# Patient Record
Sex: Male | Born: 1935 | Race: White | Hispanic: No | Smoking: Former smoker
Health system: Southern US, Community
[De-identification: ages and names within clinical notes are randomized; demographics above are authoritative.]

## PROBLEM LIST (undated history)

## (undated) DIAGNOSIS — Z951 Presence of aortocoronary bypass graft: Secondary | ICD-10-CM

## (undated) DIAGNOSIS — G4733 Obstructive sleep apnea (adult) (pediatric): Secondary | ICD-10-CM

## (undated) DIAGNOSIS — R0609 Other forms of dyspnea: Secondary | ICD-10-CM

## (undated) DIAGNOSIS — I2510 Atherosclerotic heart disease of native coronary artery without angina pectoris: Secondary | ICD-10-CM

## (undated) DIAGNOSIS — J432 Centrilobular emphysema: Secondary | ICD-10-CM

## (undated) DIAGNOSIS — L409 Psoriasis, unspecified: Secondary | ICD-10-CM

## (undated) DIAGNOSIS — E119 Type 2 diabetes mellitus without complications: Secondary | ICD-10-CM

## (undated) DIAGNOSIS — E785 Hyperlipidemia, unspecified: Secondary | ICD-10-CM

## (undated) DIAGNOSIS — J849 Interstitial pulmonary disease, unspecified: Secondary | ICD-10-CM

## (undated) DIAGNOSIS — I272 Pulmonary hypertension, unspecified: Secondary | ICD-10-CM

## (undated) DIAGNOSIS — J9611 Chronic respiratory failure with hypoxia: Secondary | ICD-10-CM

## (undated) DIAGNOSIS — I509 Heart failure, unspecified: Secondary | ICD-10-CM

## (undated) DIAGNOSIS — I10 Essential (primary) hypertension: Secondary | ICD-10-CM

## (undated) DIAGNOSIS — R0600 Dyspnea, unspecified: Secondary | ICD-10-CM

## (undated) HISTORY — DX: Type 2 diabetes mellitus without complications: E11.9

## (undated) HISTORY — DX: Atherosclerotic heart disease of native coronary artery without angina pectoris: I25.10

## (undated) HISTORY — PX: CARDIAC SURGERY: SHX584

## (undated) HISTORY — DX: Psoriasis, unspecified: L40.9

## (undated) HISTORY — PX: GALLBLADDER SURGERY: SHX652

## (undated) HISTORY — DX: Obstructive sleep apnea (adult) (pediatric): G47.33

## (undated) HISTORY — DX: Centrilobular emphysema: J43.2

## (undated) HISTORY — DX: Other forms of dyspnea: R06.09

## (undated) HISTORY — PX: PROSTATE SURGERY: SHX751

## (undated) HISTORY — DX: Essential (primary) hypertension: I10

## (undated) HISTORY — DX: Chronic respiratory failure with hypoxia: J96.11

## (undated) HISTORY — DX: Dyspnea, unspecified: R06.00

## (undated) HISTORY — DX: Presence of aortocoronary bypass graft: Z95.1

## (undated) HISTORY — DX: Hyperlipidemia, unspecified: E78.5

## (undated) HISTORY — DX: Pulmonary hypertension, unspecified: I27.20

## (undated) HISTORY — DX: Interstitial pulmonary disease, unspecified: J84.9

---

## 2014-05-09 DIAGNOSIS — I2510 Atherosclerotic heart disease of native coronary artery without angina pectoris: Secondary | ICD-10-CM | POA: Diagnosis not present

## 2014-05-09 DIAGNOSIS — Z79899 Other long term (current) drug therapy: Secondary | ICD-10-CM | POA: Diagnosis not present

## 2014-05-09 DIAGNOSIS — I10 Essential (primary) hypertension: Secondary | ICD-10-CM | POA: Diagnosis not present

## 2014-05-09 DIAGNOSIS — E119 Type 2 diabetes mellitus without complications: Secondary | ICD-10-CM | POA: Diagnosis not present

## 2014-05-09 DIAGNOSIS — E785 Hyperlipidemia, unspecified: Secondary | ICD-10-CM | POA: Diagnosis not present

## 2014-08-11 DIAGNOSIS — Z79899 Other long term (current) drug therapy: Secondary | ICD-10-CM | POA: Diagnosis not present

## 2014-08-11 DIAGNOSIS — I10 Essential (primary) hypertension: Secondary | ICD-10-CM | POA: Diagnosis not present

## 2014-08-11 DIAGNOSIS — I2510 Atherosclerotic heart disease of native coronary artery without angina pectoris: Secondary | ICD-10-CM | POA: Diagnosis not present

## 2014-08-11 DIAGNOSIS — E119 Type 2 diabetes mellitus without complications: Secondary | ICD-10-CM | POA: Diagnosis not present

## 2014-08-11 DIAGNOSIS — E785 Hyperlipidemia, unspecified: Secondary | ICD-10-CM | POA: Diagnosis not present

## 2014-11-17 DIAGNOSIS — E119 Type 2 diabetes mellitus without complications: Secondary | ICD-10-CM | POA: Diagnosis not present

## 2014-11-17 DIAGNOSIS — Z139 Encounter for screening, unspecified: Secondary | ICD-10-CM | POA: Diagnosis not present

## 2014-11-17 DIAGNOSIS — Z1389 Encounter for screening for other disorder: Secondary | ICD-10-CM | POA: Diagnosis not present

## 2014-11-17 DIAGNOSIS — I10 Essential (primary) hypertension: Secondary | ICD-10-CM | POA: Diagnosis not present

## 2014-11-17 DIAGNOSIS — E785 Hyperlipidemia, unspecified: Secondary | ICD-10-CM | POA: Diagnosis not present

## 2014-11-17 DIAGNOSIS — Z9181 History of falling: Secondary | ICD-10-CM | POA: Diagnosis not present

## 2014-11-17 DIAGNOSIS — I2510 Atherosclerotic heart disease of native coronary artery without angina pectoris: Secondary | ICD-10-CM | POA: Diagnosis not present

## 2014-11-17 DIAGNOSIS — Z79899 Other long term (current) drug therapy: Secondary | ICD-10-CM | POA: Diagnosis not present

## 2014-12-27 DIAGNOSIS — E119 Type 2 diabetes mellitus without complications: Secondary | ICD-10-CM | POA: Diagnosis not present

## 2014-12-27 DIAGNOSIS — H43813 Vitreous degeneration, bilateral: Secondary | ICD-10-CM | POA: Diagnosis not present

## 2015-02-20 DIAGNOSIS — I10 Essential (primary) hypertension: Secondary | ICD-10-CM | POA: Diagnosis not present

## 2015-02-20 DIAGNOSIS — Z79899 Other long term (current) drug therapy: Secondary | ICD-10-CM | POA: Diagnosis not present

## 2015-02-20 DIAGNOSIS — E119 Type 2 diabetes mellitus without complications: Secondary | ICD-10-CM | POA: Diagnosis not present

## 2015-02-20 DIAGNOSIS — I2510 Atherosclerotic heart disease of native coronary artery without angina pectoris: Secondary | ICD-10-CM | POA: Diagnosis not present

## 2015-02-20 DIAGNOSIS — E785 Hyperlipidemia, unspecified: Secondary | ICD-10-CM | POA: Diagnosis not present

## 2015-02-20 DIAGNOSIS — Z23 Encounter for immunization: Secondary | ICD-10-CM | POA: Diagnosis not present

## 2015-06-01 DIAGNOSIS — I2510 Atherosclerotic heart disease of native coronary artery without angina pectoris: Secondary | ICD-10-CM | POA: Diagnosis not present

## 2015-06-01 DIAGNOSIS — I10 Essential (primary) hypertension: Secondary | ICD-10-CM | POA: Diagnosis not present

## 2015-06-01 DIAGNOSIS — E119 Type 2 diabetes mellitus without complications: Secondary | ICD-10-CM | POA: Diagnosis not present

## 2015-06-01 DIAGNOSIS — E785 Hyperlipidemia, unspecified: Secondary | ICD-10-CM | POA: Diagnosis not present

## 2015-06-01 DIAGNOSIS — Z79899 Other long term (current) drug therapy: Secondary | ICD-10-CM | POA: Diagnosis not present

## 2015-06-14 DIAGNOSIS — I2510 Atherosclerotic heart disease of native coronary artery without angina pectoris: Secondary | ICD-10-CM | POA: Insufficient documentation

## 2015-06-14 DIAGNOSIS — I10 Essential (primary) hypertension: Secondary | ICD-10-CM | POA: Diagnosis not present

## 2015-06-14 DIAGNOSIS — Z951 Presence of aortocoronary bypass graft: Secondary | ICD-10-CM | POA: Diagnosis not present

## 2015-06-14 DIAGNOSIS — E785 Hyperlipidemia, unspecified: Secondary | ICD-10-CM | POA: Insufficient documentation

## 2015-06-14 DIAGNOSIS — G4733 Obstructive sleep apnea (adult) (pediatric): Secondary | ICD-10-CM | POA: Insufficient documentation

## 2015-06-27 DIAGNOSIS — E785 Hyperlipidemia, unspecified: Secondary | ICD-10-CM | POA: Diagnosis not present

## 2015-06-27 DIAGNOSIS — Z951 Presence of aortocoronary bypass graft: Secondary | ICD-10-CM | POA: Diagnosis not present

## 2015-06-27 DIAGNOSIS — I10 Essential (primary) hypertension: Secondary | ICD-10-CM | POA: Diagnosis not present

## 2015-06-27 DIAGNOSIS — I2510 Atherosclerotic heart disease of native coronary artery without angina pectoris: Secondary | ICD-10-CM | POA: Diagnosis not present

## 2015-06-27 DIAGNOSIS — G4733 Obstructive sleep apnea (adult) (pediatric): Secondary | ICD-10-CM | POA: Diagnosis not present

## 2015-08-02 DIAGNOSIS — I10 Essential (primary) hypertension: Secondary | ICD-10-CM | POA: Diagnosis not present

## 2015-08-02 DIAGNOSIS — E785 Hyperlipidemia, unspecified: Secondary | ICD-10-CM | POA: Diagnosis not present

## 2015-08-02 DIAGNOSIS — I2510 Atherosclerotic heart disease of native coronary artery without angina pectoris: Secondary | ICD-10-CM | POA: Diagnosis not present

## 2015-08-02 DIAGNOSIS — Z951 Presence of aortocoronary bypass graft: Secondary | ICD-10-CM | POA: Diagnosis not present

## 2015-08-02 DIAGNOSIS — G4733 Obstructive sleep apnea (adult) (pediatric): Secondary | ICD-10-CM | POA: Diagnosis not present

## 2015-08-29 DIAGNOSIS — H353131 Nonexudative age-related macular degeneration, bilateral, early dry stage: Secondary | ICD-10-CM | POA: Diagnosis not present

## 2015-08-30 DIAGNOSIS — Z79899 Other long term (current) drug therapy: Secondary | ICD-10-CM | POA: Diagnosis not present

## 2015-08-30 DIAGNOSIS — E785 Hyperlipidemia, unspecified: Secondary | ICD-10-CM | POA: Diagnosis not present

## 2015-08-30 DIAGNOSIS — I2510 Atherosclerotic heart disease of native coronary artery without angina pectoris: Secondary | ICD-10-CM | POA: Diagnosis not present

## 2015-08-30 DIAGNOSIS — E119 Type 2 diabetes mellitus without complications: Secondary | ICD-10-CM | POA: Diagnosis not present

## 2015-08-30 DIAGNOSIS — I10 Essential (primary) hypertension: Secondary | ICD-10-CM | POA: Diagnosis not present

## 2015-11-05 DIAGNOSIS — G4733 Obstructive sleep apnea (adult) (pediatric): Secondary | ICD-10-CM | POA: Diagnosis not present

## 2015-11-05 DIAGNOSIS — I272 Pulmonary hypertension, unspecified: Secondary | ICD-10-CM | POA: Insufficient documentation

## 2015-11-05 DIAGNOSIS — I2510 Atherosclerotic heart disease of native coronary artery without angina pectoris: Secondary | ICD-10-CM | POA: Diagnosis not present

## 2015-11-05 DIAGNOSIS — I10 Essential (primary) hypertension: Secondary | ICD-10-CM | POA: Diagnosis not present

## 2015-11-05 DIAGNOSIS — E785 Hyperlipidemia, unspecified: Secondary | ICD-10-CM | POA: Diagnosis not present

## 2015-11-05 DIAGNOSIS — Z951 Presence of aortocoronary bypass graft: Secondary | ICD-10-CM | POA: Diagnosis not present

## 2015-12-05 DIAGNOSIS — I10 Essential (primary) hypertension: Secondary | ICD-10-CM | POA: Diagnosis not present

## 2015-12-05 DIAGNOSIS — Z79899 Other long term (current) drug therapy: Secondary | ICD-10-CM | POA: Diagnosis not present

## 2015-12-05 DIAGNOSIS — E119 Type 2 diabetes mellitus without complications: Secondary | ICD-10-CM | POA: Diagnosis not present

## 2015-12-05 DIAGNOSIS — Z9181 History of falling: Secondary | ICD-10-CM | POA: Diagnosis not present

## 2015-12-05 DIAGNOSIS — Z1389 Encounter for screening for other disorder: Secondary | ICD-10-CM | POA: Diagnosis not present

## 2015-12-05 DIAGNOSIS — E785 Hyperlipidemia, unspecified: Secondary | ICD-10-CM | POA: Diagnosis not present

## 2015-12-05 DIAGNOSIS — I2510 Atherosclerotic heart disease of native coronary artery without angina pectoris: Secondary | ICD-10-CM | POA: Diagnosis not present

## 2016-02-11 DIAGNOSIS — E119 Type 2 diabetes mellitus without complications: Secondary | ICD-10-CM | POA: Diagnosis not present

## 2016-02-11 DIAGNOSIS — H524 Presbyopia: Secondary | ICD-10-CM | POA: Diagnosis not present

## 2016-03-06 DIAGNOSIS — H35373 Puckering of macula, bilateral: Secondary | ICD-10-CM | POA: Diagnosis not present

## 2016-03-11 DIAGNOSIS — Z79899 Other long term (current) drug therapy: Secondary | ICD-10-CM | POA: Diagnosis not present

## 2016-03-11 DIAGNOSIS — E785 Hyperlipidemia, unspecified: Secondary | ICD-10-CM | POA: Diagnosis not present

## 2016-03-11 DIAGNOSIS — E119 Type 2 diabetes mellitus without complications: Secondary | ICD-10-CM | POA: Diagnosis not present

## 2016-03-11 DIAGNOSIS — I10 Essential (primary) hypertension: Secondary | ICD-10-CM | POA: Diagnosis not present

## 2016-03-11 DIAGNOSIS — I2510 Atherosclerotic heart disease of native coronary artery without angina pectoris: Secondary | ICD-10-CM | POA: Diagnosis not present

## 2016-08-19 DIAGNOSIS — E119 Type 2 diabetes mellitus without complications: Secondary | ICD-10-CM | POA: Diagnosis not present

## 2016-08-19 DIAGNOSIS — Z79899 Other long term (current) drug therapy: Secondary | ICD-10-CM | POA: Diagnosis not present

## 2016-08-19 DIAGNOSIS — E785 Hyperlipidemia, unspecified: Secondary | ICD-10-CM | POA: Diagnosis not present

## 2016-08-19 DIAGNOSIS — I10 Essential (primary) hypertension: Secondary | ICD-10-CM | POA: Diagnosis not present

## 2016-08-19 DIAGNOSIS — I2510 Atherosclerotic heart disease of native coronary artery without angina pectoris: Secondary | ICD-10-CM | POA: Diagnosis not present

## 2016-09-04 DIAGNOSIS — E113293 Type 2 diabetes mellitus with mild nonproliferative diabetic retinopathy without macular edema, bilateral: Secondary | ICD-10-CM | POA: Diagnosis not present

## 2016-09-04 DIAGNOSIS — H35373 Puckering of macula, bilateral: Secondary | ICD-10-CM | POA: Diagnosis not present

## 2016-11-20 DIAGNOSIS — E119 Type 2 diabetes mellitus without complications: Secondary | ICD-10-CM | POA: Diagnosis not present

## 2016-11-20 DIAGNOSIS — Z23 Encounter for immunization: Secondary | ICD-10-CM | POA: Diagnosis not present

## 2016-11-20 DIAGNOSIS — E785 Hyperlipidemia, unspecified: Secondary | ICD-10-CM | POA: Diagnosis not present

## 2016-11-20 DIAGNOSIS — I10 Essential (primary) hypertension: Secondary | ICD-10-CM | POA: Diagnosis not present

## 2016-11-20 DIAGNOSIS — I2510 Atherosclerotic heart disease of native coronary artery without angina pectoris: Secondary | ICD-10-CM | POA: Diagnosis not present

## 2016-11-20 DIAGNOSIS — Z79899 Other long term (current) drug therapy: Secondary | ICD-10-CM | POA: Diagnosis not present

## 2016-11-20 DIAGNOSIS — Z139 Encounter for screening, unspecified: Secondary | ICD-10-CM | POA: Diagnosis not present

## 2017-02-26 DIAGNOSIS — I10 Essential (primary) hypertension: Secondary | ICD-10-CM | POA: Diagnosis not present

## 2017-02-26 DIAGNOSIS — Z79899 Other long term (current) drug therapy: Secondary | ICD-10-CM | POA: Diagnosis not present

## 2017-02-26 DIAGNOSIS — Z1389 Encounter for screening for other disorder: Secondary | ICD-10-CM | POA: Diagnosis not present

## 2017-02-26 DIAGNOSIS — E785 Hyperlipidemia, unspecified: Secondary | ICD-10-CM | POA: Diagnosis not present

## 2017-02-26 DIAGNOSIS — E119 Type 2 diabetes mellitus without complications: Secondary | ICD-10-CM | POA: Diagnosis not present

## 2017-02-26 DIAGNOSIS — Z23 Encounter for immunization: Secondary | ICD-10-CM | POA: Diagnosis not present

## 2017-02-26 DIAGNOSIS — I2510 Atherosclerotic heart disease of native coronary artery without angina pectoris: Secondary | ICD-10-CM | POA: Diagnosis not present

## 2017-02-26 DIAGNOSIS — Z139 Encounter for screening, unspecified: Secondary | ICD-10-CM | POA: Diagnosis not present

## 2017-02-26 DIAGNOSIS — Z9181 History of falling: Secondary | ICD-10-CM | POA: Diagnosis not present

## 2017-06-01 DIAGNOSIS — I2510 Atherosclerotic heart disease of native coronary artery without angina pectoris: Secondary | ICD-10-CM | POA: Diagnosis not present

## 2017-06-01 DIAGNOSIS — Z79899 Other long term (current) drug therapy: Secondary | ICD-10-CM | POA: Diagnosis not present

## 2017-06-01 DIAGNOSIS — I10 Essential (primary) hypertension: Secondary | ICD-10-CM | POA: Diagnosis not present

## 2017-06-01 DIAGNOSIS — E785 Hyperlipidemia, unspecified: Secondary | ICD-10-CM | POA: Diagnosis not present

## 2017-06-01 DIAGNOSIS — E119 Type 2 diabetes mellitus without complications: Secondary | ICD-10-CM | POA: Diagnosis not present

## 2017-06-01 DIAGNOSIS — Z139 Encounter for screening, unspecified: Secondary | ICD-10-CM | POA: Diagnosis not present

## 2017-07-21 DIAGNOSIS — H43813 Vitreous degeneration, bilateral: Secondary | ICD-10-CM | POA: Diagnosis not present

## 2017-07-21 DIAGNOSIS — E119 Type 2 diabetes mellitus without complications: Secondary | ICD-10-CM | POA: Diagnosis not present

## 2017-09-02 DIAGNOSIS — I2510 Atherosclerotic heart disease of native coronary artery without angina pectoris: Secondary | ICD-10-CM | POA: Diagnosis not present

## 2017-09-02 DIAGNOSIS — E785 Hyperlipidemia, unspecified: Secondary | ICD-10-CM | POA: Diagnosis not present

## 2017-09-02 DIAGNOSIS — E119 Type 2 diabetes mellitus without complications: Secondary | ICD-10-CM | POA: Diagnosis not present

## 2017-09-02 DIAGNOSIS — I10 Essential (primary) hypertension: Secondary | ICD-10-CM | POA: Diagnosis not present

## 2017-09-02 DIAGNOSIS — Z79899 Other long term (current) drug therapy: Secondary | ICD-10-CM | POA: Diagnosis not present

## 2017-11-02 ENCOUNTER — Encounter: Payer: Self-pay | Admitting: Cardiology

## 2017-11-02 ENCOUNTER — Ambulatory Visit (INDEPENDENT_AMBULATORY_CARE_PROVIDER_SITE_OTHER): Payer: Medicare Other | Admitting: Cardiology

## 2017-11-02 VITALS — BP 126/78 | HR 70 | Ht 70.0 in | Wt 176.0 lb

## 2017-11-02 DIAGNOSIS — I10 Essential (primary) hypertension: Secondary | ICD-10-CM | POA: Diagnosis not present

## 2017-11-02 DIAGNOSIS — I2510 Atherosclerotic heart disease of native coronary artery without angina pectoris: Secondary | ICD-10-CM | POA: Diagnosis not present

## 2017-11-02 DIAGNOSIS — I272 Pulmonary hypertension, unspecified: Secondary | ICD-10-CM

## 2017-11-02 DIAGNOSIS — R002 Palpitations: Secondary | ICD-10-CM

## 2017-11-02 DIAGNOSIS — E785 Hyperlipidemia, unspecified: Secondary | ICD-10-CM

## 2017-11-02 DIAGNOSIS — Z951 Presence of aortocoronary bypass graft: Secondary | ICD-10-CM

## 2017-11-02 DIAGNOSIS — G4733 Obstructive sleep apnea (adult) (pediatric): Secondary | ICD-10-CM | POA: Diagnosis not present

## 2017-11-02 NOTE — Patient Instructions (Signed)
Medication Instructions:  Your physician recommends that you continue on your current medications as directed. Please refer to the Current Medication list given to you today.  Labwork: None  Testing/Procedures: Your physician has requested that you have an echocardiogram. Echocardiography is a painless test that uses sound waves to create images of your heart. It provides your doctor with information about the size and shape of your heart and how well your heart's chambers and valves are working. This procedure takes approximately one hour. There are no restrictions for this procedure.  Your physician has recommended that you wear a holter monitor. Holter monitors are medical devices that record the heart's electrical activity. Doctors most often use these monitors to diagnose arrhythmias. Arrhythmias are problems with the speed or rhythm of the heartbeat. The monitor is a small, portable device. You can wear one while you do your normal daily activities. This is usually used to diagnose what is causing palpitations/syncope (passing out).  Follow-Up: Your physician recommends that you schedule a follow-up appointment in: 3 months  Any Other Special Instructions Will Be Listed Below (If Applicable).     If you need a refill on your cardiac medications before your next appointment, please call your pharmacy.   [place], RN, BSN

## 2017-11-02 NOTE — Progress Notes (Signed)
Cardiology Office Note:    Date:  11/02/2017   ID:  Louis Houston, DOB May 31, 1931, MRN [id number]  PCP:  Louis Dress, MD  Cardiologist:  Jenne Campus, MD    Referring MD: No ref. provider found   No chief complaint on file. Doing well cardiac wise  History of Present Illness:    Louis Houston is a 84 y.o. male with coronary artery disease diabetes sleep apnea dyslipidemia comes to my office for follow-up of all.  He is doing well however his wife of 14 years died recently and obviously she is grieving about that.  However he started walking on the regular basis with his dog scooter.  Denies having any chest pain tightness squeezing pressure burning chest no dizziness no passing out.  He does not use CPAP mask.  History reviewed. No pertinent past medical history.  Past Surgical History:  Procedure Laterality Date  . CARDIAC SURGERY    . GALLBLADDER SURGERY    . PROSTATE SURGERY      Current Medications: Current Meds  Medication Sig  . amLODipine (NORVASC) 10 MG tablet Take 10 mg by mouth daily.  Marland Kitchen aspirin 81 MG EC tablet Take 81 mg by mouth daily.  . furosemide (LASIX) 40 MG tablet Take 1 tablet by mouth daily.  Marland Kitchen glipiZIDE (GLUCOTROL) 5 MG tablet Take 1 tablet by mouth 2 (two) times daily.  . hydrALAZINE (APRESOLINE) 25 MG tablet Take 25 mg by mouth 3 (three) times daily.  Marland Kitchen lisinopril (PRINIVIL,ZESTRIL) 40 MG tablet Take 1 tablet by mouth daily.  . metFORMIN (GLUCOPHAGE) 500 MG tablet Take 1 tablet by mouth daily.  . metoprolol tartrate (LOPRESSOR) 50 MG tablet Take 1 tablet by mouth 2 (two) times daily.  . Multiple Vitamin (MULTI-VITAMINS) TABS Take 1 tablet by mouth daily.  . potassium chloride (K-DUR) 10 MEQ tablet Take 1 tablet by mouth daily.  . simvastatin (ZOCOR) 40 MG tablet Take 1 tablet by mouth daily.     Allergies:   Patient has no known allergies.   Social History   Socioeconomic History  . Marital status: Widowed    Spouse name: Not on file    . Number of children: Not on file  . Years of education: Not on file  . Highest education level: Not on file  Occupational History  . Not on file  Social Needs  . Financial resource strain: Not on file  . Food insecurity:    Worry: Not on file    Inability: Not on file  . Transportation needs:    Medical: Not on file    Non-medical: Not on file  Tobacco Use  . Smoking status: Former Research scientist (life sciences)  . Smokeless tobacco: Never Used  Substance and Sexual Activity  . Alcohol use: Not on file  . Drug use: Not on file  . Sexual activity: Not on file  Lifestyle  . Physical activity:    Days per week: Not on file    Minutes per session: Not on file  . Stress: Not on file  Relationships  . Social connections:    Talks on phone: Not on file    Gets together: Not on file    Attends religious service: Not on file    Active member of club or organization: Not on file    Attends meetings of clubs or organizations: Not on file    Relationship status: Not on file  Other Topics Concern  . Not on file  Social History Narrative  .  Not on file     Family History: The patient's family history includes Diabetes in his mother and sister. ROS:   Please see the history of present illness.    All 14 point review of systems negative except as described per history of present illness  EKGs/Labs/Other Studies Reviewed:    EKG showed normal sinus rhythm normal P interval APCs nonspecific ST segment changes  Recent Labs: No results found for requested labs within last 8760 hours.  Recent Lipid Panel No results found for: CHOL, TRIG, HDL, CHOLHDL, VLDL, LDLCALC, LDLDIRECT  Physical Exam:    VS:  BP 126/78 (BP Location: Right Arm, Patient Position: Sitting, Cuff Size: Normal)   Pulse 70   Ht _0  (1.778 m)   Wt 176 lb (79.8 kg)   SpO2 98%   BMI 25.25 kg/m     Wt Readings from Last 3 Encounters:  11/02/17 176 lb (79.8 kg)     GEN:  Well nourished, well developed in no acute  distress HEENT: Normal NECK: No JVD; No carotid bruits LYMPHATICS: No lymphadenopathy CARDIAC: RRR, no murmurs, no rubs, no gallops RESPIRATORY:  Clear to auscultation without rales, wheezing or rhonchi  ABDOMEN: Soft, non-tender, non-distended MUSCULOSKELETAL:  No edema; No deformity  SKIN: Warm and dry LOWER EXTREMITIES: no swelling NEUROLOGIC:  Alert and oriented x 3 PSYCHIATRIC:  Normal affect   ASSESSMENT:    1. Coronary artery disease involving native coronary artery of native heart without angina pectoris   2. Essential hypertension   3. Pulmonary hypertension ([place])   4. Obstructive sleep apnea   5. Dyslipidemia   6. Status post coronary artery bypass graft    PLAN:    In order of problems listed above:  1. Coronary artery disease denies having any chest pain tightness squeezing pressure burning chest.  Asking to repeat echocardiogram to assess left ventricular ejection fraction.   2. Essential hypertension: Blood pressure well controlled we will continue present management.  3. Pulmonary hypertension: Echocardiogram will be done to check on that. 4. Dyslipidemia: I will call primary care physician to get his fasting lipid profile. 5. Status post coronary artery bypass graft.  Doing well from that point we will continue present management. 6. Obstructive sleep apnea: Does not use CPAP mask he said he does not want to use it and he will not use it.  We will recheck his pulmonary pressure by doing echocardiogram and then advise on that  7.  Frequent supraventricular ectopy.  Will put 24-hour Holter monitor.  See him back in my office in about 3 months or sooner if he has a problem.   Medication Adjustments/Labs and Tests Ordered: Current medicines are reviewed at length with the patient today.  Concerns regarding medicines are outlined above.  No orders of the defined types were placed in this encounter.  Medication changes: No orders of the defined types were placed in  this encounter.   Signed, Park Liter, MD, [facility] 11/02/2017 9:31 AM    [place]

## 2017-11-30 ENCOUNTER — Ambulatory Visit (INDEPENDENT_AMBULATORY_CARE_PROVIDER_SITE_OTHER): Payer: Medicare Other

## 2017-11-30 ENCOUNTER — Other Ambulatory Visit: Payer: Self-pay

## 2017-11-30 DIAGNOSIS — R002 Palpitations: Secondary | ICD-10-CM

## 2017-11-30 DIAGNOSIS — I2510 Atherosclerotic heart disease of native coronary artery without angina pectoris: Secondary | ICD-10-CM

## 2017-11-30 NOTE — Progress Notes (Signed)
Echocardiogram has been performed.  Louis Houston

## 2017-12-03 DIAGNOSIS — E119 Type 2 diabetes mellitus without complications: Secondary | ICD-10-CM | POA: Diagnosis not present

## 2017-12-03 DIAGNOSIS — Z79899 Other long term (current) drug therapy: Secondary | ICD-10-CM | POA: Diagnosis not present

## 2017-12-03 DIAGNOSIS — Z9181 History of falling: Secondary | ICD-10-CM | POA: Diagnosis not present

## 2017-12-03 DIAGNOSIS — E785 Hyperlipidemia, unspecified: Secondary | ICD-10-CM | POA: Diagnosis not present

## 2017-12-03 DIAGNOSIS — Z1331 Encounter for screening for depression: Secondary | ICD-10-CM | POA: Diagnosis not present

## 2017-12-03 DIAGNOSIS — Z0000 Encounter for general adult medical examination without abnormal findings: Secondary | ICD-10-CM | POA: Diagnosis not present

## 2017-12-03 DIAGNOSIS — Z125 Encounter for screening for malignant neoplasm of prostate: Secondary | ICD-10-CM | POA: Diagnosis not present

## 2018-01-13 ENCOUNTER — Other Ambulatory Visit: Payer: Self-pay

## 2018-01-13 NOTE — Patient Outreach (Signed)
[facility] Care Management  01/13/2018  ANIBAL QUINBY 1932-02-23 [id number]   Medication Adherence call to Mr. Jessi Jessop patient did not answer patient is due on Glipizide 5 mg and Lisinopril 40 mg. Mr. Staheli is showing past due under [place].   [place] Management Direct Dial [phone]  Fax [phone] [email or web address]

## 2018-01-20 DIAGNOSIS — H353132 Nonexudative age-related macular degeneration, bilateral, intermediate dry stage: Secondary | ICD-10-CM | POA: Diagnosis not present

## 2018-02-02 ENCOUNTER — Other Ambulatory Visit: Payer: Self-pay

## 2018-02-02 NOTE — Patient Outreach (Signed)
Louis Houston) Care Management  02/02/2018  Louis Houston June 08, 1931 [id number]   Medication Adherence call to Mr. Louis Houston  patient did not want to engage patient did not want to give birthday. Patient is due on Glipizide 5 mg and Lisinopril 40 mg. Louis Houston is showing past due under [place].   [place] Management Direct Dial [phone]  Fax [phone] [name]

## 2018-03-09 DIAGNOSIS — I10 Essential (primary) hypertension: Secondary | ICD-10-CM | POA: Diagnosis not present

## 2018-03-09 DIAGNOSIS — E119 Type 2 diabetes mellitus without complications: Secondary | ICD-10-CM | POA: Diagnosis not present

## 2018-03-09 DIAGNOSIS — I2510 Atherosclerotic heart disease of native coronary artery without angina pectoris: Secondary | ICD-10-CM | POA: Diagnosis not present

## 2018-03-09 DIAGNOSIS — E785 Hyperlipidemia, unspecified: Secondary | ICD-10-CM | POA: Diagnosis not present

## 2018-03-09 DIAGNOSIS — Z23 Encounter for immunization: Secondary | ICD-10-CM | POA: Diagnosis not present

## 2018-03-09 DIAGNOSIS — Z79899 Other long term (current) drug therapy: Secondary | ICD-10-CM | POA: Diagnosis not present

## 2018-03-11 ENCOUNTER — Ambulatory Visit (INDEPENDENT_AMBULATORY_CARE_PROVIDER_SITE_OTHER): Payer: Medicare Other | Admitting: Cardiology

## 2018-03-11 ENCOUNTER — Encounter: Payer: Self-pay | Admitting: Cardiology

## 2018-03-11 VITALS — BP 140/70 | HR 58 | Ht 70.0 in | Wt 184.6 lb

## 2018-03-11 DIAGNOSIS — Z951 Presence of aortocoronary bypass graft: Secondary | ICD-10-CM

## 2018-03-11 DIAGNOSIS — I272 Pulmonary hypertension, unspecified: Secondary | ICD-10-CM | POA: Diagnosis not present

## 2018-03-11 DIAGNOSIS — I2510 Atherosclerotic heart disease of native coronary artery without angina pectoris: Secondary | ICD-10-CM | POA: Diagnosis not present

## 2018-03-11 DIAGNOSIS — I10 Essential (primary) hypertension: Secondary | ICD-10-CM | POA: Diagnosis not present

## 2018-03-11 DIAGNOSIS — G4733 Obstructive sleep apnea (adult) (pediatric): Secondary | ICD-10-CM

## 2018-03-11 DIAGNOSIS — E785 Hyperlipidemia, unspecified: Secondary | ICD-10-CM | POA: Diagnosis not present

## 2018-03-11 NOTE — Progress Notes (Signed)
Cardiology Office Note:    Date:  03/11/2018   ID:  RHONDA VANGIESON, DOB 04/16/1932, MRN [id number]  PCP:  Nicoletta Dress, MD  Cardiologist:  Jenne Campus, MD    Referring MD: Nicoletta Dress, MD   Chief Complaint  Patient presents with  . Follow-up  Doing well  History of Present Illness:    Louis Houston is a 84 y.o. male with coronary artery disease status post coronary artery bypass graft, pulmonary hypertension with pulmonary pressure neb-year-old 50 to 70 mmHg, essential hypertension, sleep apnea comes today to my office follow-up overall doing well he lost his wife about a year ago but seems to be doing well he does have a dog named scooter that he walks with all the time.  Sometimes when he will walk uphill he had to slow down and stop to catch his breath but overall no change.  This is table pattern.  Denies have any chest pain tightness squeezing pressure burning chest.  2 of his daughter he lives close by one across the street from him she check on him every day and she brings him some food.  No past medical history on file.  Past Surgical History:  Procedure Laterality Date  . CARDIAC SURGERY    . GALLBLADDER SURGERY    . PROSTATE SURGERY      Current Medications: Current Meds  Medication Sig  . amLODipine (NORVASC) 10 MG tablet Take 10 mg by mouth daily.  Marland Kitchen aspirin 81 MG EC tablet Take 81 mg by mouth daily.  . furosemide (LASIX) 40 MG tablet Take 1 tablet by mouth daily.  Marland Kitchen glipiZIDE (GLUCOTROL) 5 MG tablet Take 1 tablet by mouth 2 (two) times daily.  . hydrALAZINE (APRESOLINE) 25 MG tablet Take 25 mg by mouth 2 (two) times daily.   Marland Kitchen lisinopril (PRINIVIL,ZESTRIL) 40 MG tablet Take 1 tablet by mouth daily.  . metFORMIN (GLUCOPHAGE) 500 MG tablet Take 1 tablet by mouth daily.  . metoprolol tartrate (LOPRESSOR) 50 MG tablet Take 1 tablet by mouth 2 (two) times daily.  . Multiple Vitamin (MULTI-VITAMINS) TABS Take 1 tablet by mouth daily.  . potassium  chloride (K-DUR) 10 MEQ tablet Take 1 tablet by mouth daily.  . simvastatin (ZOCOR) 40 MG tablet Take 1 tablet by mouth daily.     Allergies:   Patient has no known allergies.   Social History   Socioeconomic History  . Marital status: Widowed    Spouse name: Not on file  . Number of children: Not on file  . Years of education: Not on file  . Highest education level: Not on file  Occupational History  . Not on file  Social Needs  . Financial resource strain: Not on file  . Food insecurity:    Worry: Not on file    Inability: Not on file  . Transportation needs:    Medical: Not on file    Non-medical: Not on file  Tobacco Use  . Smoking status: Former Research scientist (life sciences)  . Smokeless tobacco: Never Used  Substance and Sexual Activity  . Alcohol use: Not on file  . Drug use: Not on file  . Sexual activity: Not on file  Lifestyle  . Physical activity:    Days per week: Not on file    Minutes per session: Not on file  . Stress: Not on file  Relationships  . Social connections:    Talks on phone: Not on file    Gets together: Not  on file    Attends religious service: Not on file    Active member of club or organization: Not on file    Attends meetings of clubs or organizations: Not on file    Relationship status: Not on file  Other Topics Concern  . Not on file  Social History Narrative  . Not on file     Family History: The patient's family history includes Diabetes in his mother and sister. ROS:   Please see the history of present illness.    All 14 point review of systems negative except as described per history of present illness  EKGs/Labs/Other Studies Reviewed:      Recent Labs: No results found for requested labs within last 8760 hours.  Recent Lipid Panel No results found for: CHOL, TRIG, HDL, CHOLHDL, VLDL, LDLCALC, LDLDIRECT  Physical Exam:    VS:  BP 140/70   Pulse (!) 58   Ht _0  (1.778 m)   Wt 184 lb 9.6 oz (83.7 kg)   SpO2 94%   BMI 26.49 kg/m      Wt Readings from Last 3 Encounters:  03/11/18 184 lb 9.6 oz (83.7 kg)  11/02/17 176 lb (79.8 kg)     GEN:  Well nourished, well developed in no acute distress HEENT: Normal NECK: No JVD; No carotid bruits LYMPHATICS: No lymphadenopathy CARDIAC: RRR, no murmurs, no rubs, no gallops RESPIRATORY:  Clear to auscultation without rales, wheezing or rhonchi  ABDOMEN: Soft, non-tender, non-distended MUSCULOSKELETAL:  No edema; No deformity  SKIN: Warm and dry LOWER EXTREMITIES: no swelling NEUROLOGIC:  Alert and oriented x 3 PSYCHIATRIC:  Normal affect   ASSESSMENT:    1. Coronary artery disease involving native coronary artery of native heart without angina pectoris   2. Essential hypertension   3. Pulmonary hypertension ([place])   4. Obstructive sleep apnea   5. Dyslipidemia   6. Status post coronary artery bypass graft    PLAN:    In order of problems listed above:  1. Coronary artery disease doing well from that point review asymptomatic continue present management. 2. Essential hypertension doing well from that point review.  Blood pressure well controlled. 3. Pulmonary hypertension stable no new symptoms we will repeat echocardiogram 6 months from now 4. Obstructive sleep apnea follow-up by internal medicine team. 5. Dyslipidemia he is on simvastatin 40 which I will continue we will call primary care physician to get fasting lipid profile.   Medication Adjustments/Labs and Tests Ordered: Current medicines are reviewed at length with the patient today.  Concerns regarding medicines are outlined above.  No orders of the defined types were placed in this encounter.  Medication changes: No orders of the defined types were placed in this encounter.   Signed, Park Liter, MD, [facility] 03/11/2018 10:51 AM    [place]

## 2018-03-11 NOTE — Patient Instructions (Signed)
Medication Instructions:  Your physician recommends that you continue on your current medications as directed. Please refer to the Current Medication list given to you today.  If you need a refill on your cardiac medications before your next appointment, please call your pharmacy.   Lab work: None.  If you have labs (blood work) drawn today and your tests are completely normal, you will receive your results only by: Marland Kitchen MyChart Message (if you have MyChart) OR . A paper copy in the mail If you have any lab test that is abnormal or we need to change your treatment, we will call you to review the results.  Testing/Procedures: None.   Follow-Up: At [facility], you and your health needs are our priority.  As part of our continuing mission to provide you with exceptional heart care, we have created designated Provider Care Teams.  These Care Teams include your primary Cardiologist (physician) and Advanced Practice Providers (APPs -  Physician Assistants and Nurse Practitioners) who all work together to provide you with the care you need, when you need it. You will need a follow up appointment in 6 months.  Please call our office 2 months in advance to schedule this appointment.  You may see Dr. Agustin Cree or another member of our [place] Provider Team in [place]: Shirlee More, MD . Jyl Heinz, MD  Any Other Special Instructions Will Be Listed Below (If Applicable).

## 2018-06-15 DIAGNOSIS — E785 Hyperlipidemia, unspecified: Secondary | ICD-10-CM | POA: Diagnosis not present

## 2018-06-15 DIAGNOSIS — Z79899 Other long term (current) drug therapy: Secondary | ICD-10-CM | POA: Diagnosis not present

## 2018-06-15 DIAGNOSIS — E119 Type 2 diabetes mellitus without complications: Secondary | ICD-10-CM | POA: Diagnosis not present

## 2018-06-15 DIAGNOSIS — I10 Essential (primary) hypertension: Secondary | ICD-10-CM | POA: Diagnosis not present

## 2018-06-16 ENCOUNTER — Encounter: Payer: Self-pay | Admitting: Cardiology

## 2018-08-24 ENCOUNTER — Telehealth: Payer: Self-pay | Admitting: Cardiology

## 2018-08-24 NOTE — Telephone Encounter (Signed)
Virtual Visit Pre-Appointment Phone Call  "(Name), I am calling you today to discuss your upcoming appointment. We are currently trying to limit exposure to the virus that causes COVID-19 by seeing patients at home rather than in the office."  1. "What is the BEST phone number to call the day of the visit?" - include this in appointment notes  2. Do you have or have access to (through a family member/friend) a smartphone with video capability that we can use for your visit?" a. If yes - list this number in appt notes as cell (if different from BEST phone #) and list the appointment type as a VIDEO visit in appointment notes b. If no - list the appointment type as a PHONE visit in appointment notes  3. Confirm consent - "In the setting of the current Covid19 crisis, you are scheduled for a (phone or video) visit with your provider on (date) at (time).  Just as we do with many in-office visits, in order for you to participate in this visit, we must obtain consent.  If you'd like, I can send this to your mychart (if signed up) or email for you to review.  Otherwise, I can obtain your verbal consent now.  All virtual visits are billed to your insurance company just like a normal visit would be.  By agreeing to a virtual visit, we'd like you to understand that the technology does not allow for your provider to perform an examination, and thus may limit your provider's ability to fully assess your condition. If your provider identifies any concerns that need to be evaluated in person, we will make arrangements to do so.  Finally, though the technology is pretty good, we cannot assure that it will always work on either your or our end, and in the setting of a video visit, we may have to convert it to a phone-only visit.  In either situation, we cannot ensure that we have a secure connection.  Are you willing to proceed?" STAFF: Did the patient verbally acknowledge consent to telehealth visit? Document  YES/NO here: YES  4. Advise patient to be prepared - "Two hours prior to your appointment, go ahead and check your blood pressure, pulse, oxygen saturation, and your weight (if you have the equipment to check those) and write them all down. When your visit starts, your provider will ask you for this information. If you have an Apple Watch or Kardia device, please plan to have heart rate information ready on the day of your appointment. Please have a pen and paper handy nearby the day of the visit as well."  5. Give patient instructions for MyChart download to smartphone OR Doximity/Doxy.me as below if video visit (depending on what platform provider is using)  6. Inform patient they will receive a phone call 15 minutes prior to their appointment time (may be from unknown caller ID) so they should be prepared to answer    TELEPHONE CALL NOTE  Louis Houston has been deemed a candidate for a follow-up tele-health visit to limit community exposure during the Covid-19 pandemic. I spoke with the patient via phone to ensure availability of phone/video source, confirm preferred email & phone number, and discuss instructions and expectations.  I reminded Louis Houston to be prepared with any vital sign and/or heart rhythm information that could potentially be obtained via home monitoring, at the time of his visit. I reminded Louis Houston to expect a phone call prior to  his visit.  Louis Houston 08/24/2018 11:22 AM   INSTRUCTIONS FOR DOWNLOADING THE MYCHART APP TO SMARTPHONE  - The patient must first make sure to have activated MyChart and know their login information - If Apple, go to [employer] and type in MyChart in the search bar and download the app. If Android, ask patient to go to [employer] and type in [facility] in the search bar and download the app. The app is free but as with any other app downloads, their phone may require them to verify saved payment information or Apple/Android  password.  - The patient will need to then log into the app with their MyChart username and password, and select [facility] as their healthcare provider to link the account. When it is time for your visit, go to the MyChart app, find appointments, and click Begin Video Visit. Be sure to Select Allow for your device to access the Microphone and Camera for your visit. You will then be connected, and your provider will be with you shortly.  **If they have any issues connecting, or need assistance please contact MyChart service desk [phone])**  **If using a computer, in order to ensure the best quality for their visit they will need to use either of the following Internet Browsers: [employer], or Google Chrome**  IF USING DOXIMITY or DOXY.ME - The patient will receive a link just prior to their visit by text.     FULL LENGTH CONSENT FOR TELE-HEALTH VISIT   I hereby voluntarily request, consent and authorize [facility] and its employed or contracted physicians, physician assistants, nurse practitioners or other licensed health care professionals (the Practitioner), to provide me with telemedicine health care services (the Services") as deemed necessary by the treating Practitioner. I acknowledge and consent to receive the Services by the Practitioner via telemedicine. I understand that the telemedicine visit will involve communicating with the Practitioner through live audiovisual communication technology and the disclosure of certain medical information by electronic transmission. I acknowledge that I have been given the opportunity to request an in-person assessment or other available alternative prior to the telemedicine visit and am voluntarily participating in the telemedicine visit.  I understand that I have the right to withhold or withdraw my consent to the use of telemedicine in the course of my care at any time, without affecting my right to future care or treatment,  and that the Practitioner or I may terminate the telemedicine visit at any time. I understand that I have the right to inspect all information obtained and/or recorded in the course of the telemedicine visit and may receive copies of available information for a reasonable fee.  I understand that some of the potential risks of receiving the Services via telemedicine include:   Delay or interruption in medical evaluation due to technological equipment failure or disruption;  Information transmitted may not be sufficient (e.g. poor resolution of images) to allow for appropriate medical decision making by the Practitioner; and/or   In rare instances, security protocols could fail, causing a breach of personal health information.  Furthermore, I acknowledge that it is my responsibility to provide information about my medical history, conditions and care that is complete and accurate to the best of my ability. I acknowledge that Practitioner's advice, recommendations, and/or decision may be based on factors not within their control, such as incomplete or inaccurate data provided by me or distortions of diagnostic images or specimens that may result from electronic transmissions. I  understand that the practice of medicine is not an exact science and that Practitioner makes no warranties or guarantees regarding treatment outcomes. I acknowledge that I will receive a copy of this consent concurrently upon execution via email to the email address I last provided but may also request a printed copy by calling the office of [facility].    I understand that my insurance will be billed for this visit.   I have read or had this consent read to me.  I understand the contents of this consent, which adequately explains the benefits and risks of the Services being provided via telemedicine.   I have been provided ample opportunity to ask questions regarding this consent and the Services and have had my questions  answered to my satisfaction.  I give my informed consent for the services to be provided through the use of telemedicine in my medical care  By participating in this telemedicine visit I agree to the above.

## 2018-09-06 ENCOUNTER — Encounter: Payer: Self-pay | Admitting: Cardiology

## 2018-09-06 ENCOUNTER — Other Ambulatory Visit: Payer: Self-pay

## 2018-09-06 ENCOUNTER — Telehealth (INDEPENDENT_AMBULATORY_CARE_PROVIDER_SITE_OTHER): Payer: Medicare Other | Admitting: Cardiology

## 2018-09-06 ENCOUNTER — Telehealth: Payer: Self-pay | Admitting: Emergency Medicine

## 2018-09-06 VITALS — BP 124/62 | HR 65 | Wt 185.2 lb

## 2018-09-06 DIAGNOSIS — I2510 Atherosclerotic heart disease of native coronary artery without angina pectoris: Secondary | ICD-10-CM

## 2018-09-06 DIAGNOSIS — Z951 Presence of aortocoronary bypass graft: Secondary | ICD-10-CM

## 2018-09-06 DIAGNOSIS — I10 Essential (primary) hypertension: Secondary | ICD-10-CM

## 2018-09-06 DIAGNOSIS — E785 Hyperlipidemia, unspecified: Secondary | ICD-10-CM

## 2018-09-06 DIAGNOSIS — I272 Pulmonary hypertension, unspecified: Secondary | ICD-10-CM

## 2018-09-06 NOTE — Addendum Note (Signed)
Addended by: Ashok Norris on: 09/06/2018 03:42 PM   Modules accepted: Orders

## 2018-09-06 NOTE — Patient Instructions (Signed)
Medication Instructions:  Your physician recommends that you continue on your current medications as directed. Please refer to the Current Medication list given to you today.  If you need a refill on your cardiac medications before your next appointment, please call your pharmacy.   Lab work: None.  If you have labs (blood work) drawn today and your tests are completely normal, you will receive your results only by: Marland Kitchen MyChart Message (if you have MyChart) OR . A paper copy in the mail If you have any lab test that is abnormal or we need to change your treatment, we will call you to review the results.  Testing/Procedures:  Your physician has requested that you have an echocardiogram. Echocardiography is a painless test that uses sound waves to create images of your heart. It provides your doctor with information about the size and shape of your heart and how well your heart's chambers and valves are working. This procedure takes approximately one hour. There are no restrictions for this procedure.     Follow-Up: At [facility], you and your health needs are our priority.  As part of our continuing mission to provide you with exceptional heart care, we have created designated Provider Care Teams.  These Care Teams include your primary Cardiologist (physician) and Advanced Practice Providers (APPs -  Physician Assistants and Nurse Practitioners) who all work together to provide you with the care you need, when you need it. You will need a follow up appointment in 5 months.  Please call our office 2 months in advance to schedule this appointment.  You may see No primary care provider on file. or another member of our [employer] Provider Team in [place]: Shirlee More, MD . Jyl Heinz, MD  Any Other Special Instructions Will Be Listed Below (If Applicable).   Echocardiogram An echocardiogram is a procedure that uses painless sound waves (ultrasound) to produce an image of the  heart. Images from an echocardiogram can provide important information about:  Signs of coronary artery disease (CAD).  Aneurysm detection. An aneurysm is a weak or damaged part of an artery wall that bulges out from the normal force of blood pumping through the body.  Heart size and shape. Changes in the size or shape of the heart can be associated with certain conditions, including heart failure, aneurysm, and CAD.  Heart muscle function.  Heart valve function.  Signs of a past heart attack.  Fluid buildup around the heart.  Thickening of the heart muscle.  A tumor or infectious growth around the heart valves. Tell a health care provider about:  Any allergies you have.  All medicines you are taking, including vitamins, herbs, eye drops, creams, and over-the-counter medicines.  Any blood disorders you have.  Any surgeries you have had.  Any medical conditions you have.  Whether you are pregnant or may be pregnant. What are the risks? Generally, this is a safe procedure. However, problems may occur, including:  Allergic reaction to dye (contrast) that may be used during the procedure. What happens before the procedure? No specific preparation is needed. You may eat and drink normally. What happens during the procedure?   An IV tube may be inserted into one of your veins.  You may receive contrast through this tube. A contrast is an injection that improves the quality of the pictures from your heart.  A gel will be applied to your chest.  A wand-like tool (transducer) will be moved over your chest. The gel will  help to transmit the sound waves from the transducer.  The sound waves will harmlessly bounce off of your heart to allow the heart images to be captured in real-time motion. The images will be recorded on a computer. The procedure may vary among health care providers and hospitals. What happens after the procedure?  You may return to your normal, everyday  life, including diet, activities, and medicines, unless your health care provider tells you not to do that. Summary  An echocardiogram is a procedure that uses painless sound waves (ultrasound) to produce an image of the heart.  Images from an echocardiogram can provide important information about the size and shape of your heart, heart muscle function, heart valve function, and fluid buildup around your heart.  You do not need to do anything to prepare before this procedure. You may eat and drink normally.  After the echocardiogram is completed, you may return to your normal, everyday life, unless your health care provider tells you not to do that. This information is not intended to replace advice given to you by your health care provider. Make sure you discuss any questions you have with your health care provider. Document Released: 04/18/2000 Document Revised: 05/24/2016 Document Reviewed: 05/24/2016 Elsevier Interactive Patient Education  2019 [employer].

## 2018-09-06 NOTE — Telephone Encounter (Signed)
Left message for patient to return call to inform and schedule f/u appt and put patients echo in recall.

## 2018-09-06 NOTE — Progress Notes (Signed)
Virtual Visit via Telephone Note   This visit type was conducted due to national recommendations for restrictions regarding the COVID-19 Pandemic (e.g. social distancing) in an effort to limit this patient's exposure and mitigate transmission in our community.  Due to his co-morbid illnesses, this patient is at least at moderate risk for complications without adequate follow up.  This format is felt to be most appropriate for this patient at this time.  The patient did not have access to video technology/had technical difficulties with video requiring transitioning to audio format only (telephone).  All issues noted in this document were discussed and addressed.  No physical exam could be performed with this format.  Please refer to the patient's chart for his  consent to telehealth for [facility].  Evaluation Performed:  Follow-up visit  This visit type was conducted due to national recommendations for restrictions regarding the COVID-19 Pandemic (e.g. social distancing).  This format is felt to be most appropriate for this patient at this time.  All issues noted in this document were discussed and addressed.  No physical exam was performed (except for noted visual exam findings with Video Visits).  Please refer to the patient's chart (MyChart message for video visits and phone note for telephone visits) for the patient's consent to telehealth for [facility].  Date:  09/06/2018  ID: Louis Houston, DOB Sep 26, 1931, MRN [id number]   Patient Location: [address] [place] [address]   Provider location:   [place] Office  PCP:  Nicoletta Dress, MD  Cardiologist:  Jenne Campus, MD     Chief Complaint: I am doing well  History of Present Illness:    Louis Houston is a 85 y.o. male  who presents via audio/video conferencing for a telehealth visit today.  With history of pulmonary hypertension with pulmonary pressure in the neighborhood of 50 to 60 mmHg, coronary  artery disease, status post coronary artery bypass graft years ago.  Also hypertension dyslipidemia I am calling today him on the phone he got no technical ability to establish video link he said he is doing well described to have some exertional shortness of breath as usual.  He still keep going and walking with his dog naming scooter and is enjoying that.  When asking how we compare his ability to exercise now with 6 months ago he said he is better he thinks this is related to the fact that weather is better he is able to do more   The patient does not have symptoms concerning for COVID-19 infection (fever, chills, cough, or new SHORTNESS OF BREATH).    Prior CV studies:   The following studies were reviewed today:       No past medical history on file.  Past Surgical History:  Procedure Laterality Date  . CARDIAC SURGERY    . GALLBLADDER SURGERY    . PROSTATE SURGERY       Current Meds  Medication Sig  . amLODipine (NORVASC) 10 MG tablet Take 10 mg by mouth daily.  Marland Kitchen aspirin 81 MG EC tablet Take 81 mg by mouth daily.  . furosemide (LASIX) 40 MG tablet Take 1 tablet by mouth daily.  Marland Kitchen glipiZIDE (GLUCOTROL) 5 MG tablet Take 1 tablet by mouth 2 (two) times daily.  . hydrALAZINE (APRESOLINE) 25 MG tablet Take 25 mg by mouth 2 (two) times daily.   Marland Kitchen lisinopril (PRINIVIL,ZESTRIL) 40 MG tablet Take 1 tablet by mouth daily.  . metFORMIN (GLUCOPHAGE)  500 MG tablet Take 1 tablet by mouth daily.  . metoprolol tartrate (LOPRESSOR) 50 MG tablet Take 1 tablet by mouth 2 (two) times daily.  . Multiple Vitamin (MULTI-VITAMINS) TABS Take 1 tablet by mouth daily.  . potassium chloride (K-DUR) 10 MEQ tablet Take 1 tablet by mouth daily.  . simvastatin (ZOCOR) 40 MG tablet Take 1 tablet by mouth daily.      Family History: The patient's family history includes Diabetes in his mother and sister.   ROS:   Please see the history of present illness.     All other systems reviewed and are  negative.   Labs/Other Tests and Data Reviewed:     Recent Labs: No results found for requested labs within last 8760 hours.  Recent Lipid Panel No results found for: CHOL, TRIG, HDL, CHOLHDL, VLDL, LDLCALC, LDLDIRECT    Exam:    Vital Signs:  BP 124/62   Pulse 65   Wt 185 lb 3.2 oz (84 kg)   BMI 26.57 kg/m     Wt Readings from Last 3 Encounters:  09/06/18 185 lb 3.2 oz (84 kg)  03/11/18 184 lb 9.6 oz (83.7 kg)  11/02/17 176 lb (79.8 kg)     Well nourished, well developed in no acute distress. Alert awake oriented x3.  Happy to be able to talk to me.  Diagnosis for this visit:   1. Coronary artery disease involving native coronary artery of native heart without angina pectoris   2. Essential hypertension   3. Pulmonary hypertension ([place])   4. Dyslipidemia   5. Status post coronary artery bypass graft      ASSESSMENT & PLAN:    1.  Coronary artery disease doing well from that point of view.  Asymptomatic on appropriate medications which I will continue. 2.  Essential hypertension blood pressure well controlled continue present management. 3.  Pulmonary hypertension from symptoms point review doing well I will ask you to have an echocardiogram to check pulmonary artery pressure.  His ability to exercise overall is limited because of his age.  Therefore it is difficult to judge objectively his pulmonary pressure. 4.  Dyslipidemia will wait until coronavirus situation will quiet down before we repeat the test. 5.  Status post coronary artery bypass graft.  Noted.  COVID-19 Education: The signs and symptoms of COVID-19 were discussed with the patient and how to seek care for testing (follow up with PCP or arrange E-visit).  The importance of social distancing was discussed today.  Patient Risk:   After full review of this patients clinical status, I feel that they are at least moderate risk at this time.  Time:   Today, I have spent 19 minutes with the patient with  telehealth technology discussing pt health issues.  I spent 5 minutes reviewing her chart before the visit.  Visit was finished at 3:06 PM.    Medication Adjustments/Labs and Tests Ordered: Current medicines are reviewed at length with the patient today.  Concerns regarding medicines are outlined above.  No orders of the defined types were placed in this encounter.  Medication changes: No orders of the defined types were placed in this encounter.    Disposition: Follow-up with me in 5 months we will do echocardiogram in 3 months  Signed, Park Liter, MD, [facility] 09/06/2018 3:08 PM    [place]

## 2018-10-20 DIAGNOSIS — I10 Essential (primary) hypertension: Secondary | ICD-10-CM | POA: Diagnosis not present

## 2018-10-20 DIAGNOSIS — E1169 Type 2 diabetes mellitus with other specified complication: Secondary | ICD-10-CM | POA: Diagnosis not present

## 2018-10-20 DIAGNOSIS — Z139 Encounter for screening, unspecified: Secondary | ICD-10-CM | POA: Diagnosis not present

## 2018-10-20 DIAGNOSIS — E785 Hyperlipidemia, unspecified: Secondary | ICD-10-CM | POA: Diagnosis not present

## 2018-10-20 DIAGNOSIS — I2510 Atherosclerotic heart disease of native coronary artery without angina pectoris: Secondary | ICD-10-CM | POA: Diagnosis not present

## 2018-10-20 DIAGNOSIS — Z79899 Other long term (current) drug therapy: Secondary | ICD-10-CM | POA: Diagnosis not present

## 2018-12-07 DIAGNOSIS — Z0000 Encounter for general adult medical examination without abnormal findings: Secondary | ICD-10-CM | POA: Diagnosis not present

## 2018-12-07 DIAGNOSIS — E785 Hyperlipidemia, unspecified: Secondary | ICD-10-CM | POA: Diagnosis not present

## 2018-12-07 DIAGNOSIS — Z9181 History of falling: Secondary | ICD-10-CM | POA: Diagnosis not present

## 2018-12-22 DIAGNOSIS — E119 Type 2 diabetes mellitus without complications: Secondary | ICD-10-CM | POA: Diagnosis not present

## 2018-12-22 DIAGNOSIS — H35373 Puckering of macula, bilateral: Secondary | ICD-10-CM | POA: Diagnosis not present

## 2019-01-20 DIAGNOSIS — Z23 Encounter for immunization: Secondary | ICD-10-CM | POA: Diagnosis not present

## 2019-01-20 DIAGNOSIS — E785 Hyperlipidemia, unspecified: Secondary | ICD-10-CM | POA: Diagnosis not present

## 2019-01-20 DIAGNOSIS — I10 Essential (primary) hypertension: Secondary | ICD-10-CM | POA: Diagnosis not present

## 2019-01-20 DIAGNOSIS — Z79899 Other long term (current) drug therapy: Secondary | ICD-10-CM | POA: Diagnosis not present

## 2019-01-20 DIAGNOSIS — E1169 Type 2 diabetes mellitus with other specified complication: Secondary | ICD-10-CM | POA: Diagnosis not present

## 2019-01-20 DIAGNOSIS — I2510 Atherosclerotic heart disease of native coronary artery without angina pectoris: Secondary | ICD-10-CM | POA: Diagnosis not present

## 2019-04-21 DIAGNOSIS — E1169 Type 2 diabetes mellitus with other specified complication: Secondary | ICD-10-CM | POA: Diagnosis not present

## 2019-04-21 DIAGNOSIS — I10 Essential (primary) hypertension: Secondary | ICD-10-CM | POA: Diagnosis not present

## 2019-04-21 DIAGNOSIS — E785 Hyperlipidemia, unspecified: Secondary | ICD-10-CM | POA: Diagnosis not present

## 2019-04-21 DIAGNOSIS — Z79899 Other long term (current) drug therapy: Secondary | ICD-10-CM | POA: Diagnosis not present

## 2019-04-21 DIAGNOSIS — I2510 Atherosclerotic heart disease of native coronary artery without angina pectoris: Secondary | ICD-10-CM | POA: Diagnosis not present

## 2019-07-22 DIAGNOSIS — Z79899 Other long term (current) drug therapy: Secondary | ICD-10-CM | POA: Diagnosis not present

## 2019-07-22 DIAGNOSIS — E1169 Type 2 diabetes mellitus with other specified complication: Secondary | ICD-10-CM | POA: Diagnosis not present

## 2019-07-22 DIAGNOSIS — I10 Essential (primary) hypertension: Secondary | ICD-10-CM | POA: Diagnosis not present

## 2019-07-22 DIAGNOSIS — E785 Hyperlipidemia, unspecified: Secondary | ICD-10-CM | POA: Diagnosis not present

## 2019-07-22 DIAGNOSIS — I2510 Atherosclerotic heart disease of native coronary artery without angina pectoris: Secondary | ICD-10-CM | POA: Diagnosis not present

## 2019-08-16 ENCOUNTER — Telehealth: Payer: Self-pay | Admitting: Cardiology

## 2019-08-16 NOTE — Telephone Encounter (Signed)
Pt c/o Shortness Of Breath: STAT if SOB developed within the last 24 hours or pt is noticeably SOB on the phone  1. Are you currently SOB (can you hear that pt is SOB on the phone)? No, not currently with patient  2. How long have you been experiencing SOB? 3 months  3. Are you SOB when sitting or when up moving around? Moving around  4. Are you currently experiencing any other symptoms? congestion   Patient's daughter states the patient has been SOB for 3 months. He has an appointment 09/26/2019. She states she does not need a call back unless he needs to be seen sooner.

## 2019-08-16 NOTE — Telephone Encounter (Signed)
I spoke to the patient who says that he has been experiencing SOB for the past 6 months, but it has gotten worse lately with exertion.  He has also had a cough and congestion at times.    He denies CP, but has minimal swelling in the ankles, which dissipates over time.  He has an appointment with Dr Agustin Cree on 5/24, but would like something sooner.

## 2019-08-16 NOTE — Telephone Encounter (Signed)
Called patient and let him know per Dr. Agustin Cree we will move up appointment earliest he can do is next Friday 08/26/19. Patient is scheduled for then. Advised him to call us if he has concerns sooner.

## 2019-08-26 ENCOUNTER — Telehealth: Payer: Self-pay | Admitting: *Deleted

## 2019-08-26 ENCOUNTER — Ambulatory Visit (HOSPITAL_COMMUNITY)
Admission: RE | Admit: 2019-08-26 | Discharge: 2019-08-26 | Disposition: A | Discharge status: Home / Self Care | Payer: Medicare Other | Source: Ambulatory Visit | Attending: Cardiology | Admitting: Cardiology

## 2019-08-26 ENCOUNTER — Ambulatory Visit: Payer: Medicare Other | Admitting: Cardiology

## 2019-08-26 ENCOUNTER — Encounter: Payer: Self-pay | Admitting: Cardiology

## 2019-08-26 ENCOUNTER — Other Ambulatory Visit: Payer: Self-pay | Admitting: *Deleted

## 2019-08-26 ENCOUNTER — Other Ambulatory Visit: Payer: Self-pay

## 2019-08-26 VITALS — BP 102/72 | HR 63 | Ht 70.0 in | Wt 184.0 lb

## 2019-08-26 DIAGNOSIS — R0609 Other forms of dyspnea: Secondary | ICD-10-CM | POA: Insufficient documentation

## 2019-08-26 DIAGNOSIS — I071 Rheumatic tricuspid insufficiency: Secondary | ICD-10-CM | POA: Insufficient documentation

## 2019-08-26 DIAGNOSIS — I2510 Atherosclerotic heart disease of native coronary artery without angina pectoris: Secondary | ICD-10-CM

## 2019-08-26 DIAGNOSIS — R0602 Shortness of breath: Secondary | ICD-10-CM

## 2019-08-26 DIAGNOSIS — R0600 Dyspnea, unspecified: Secondary | ICD-10-CM

## 2019-08-26 DIAGNOSIS — I10 Essential (primary) hypertension: Secondary | ICD-10-CM

## 2019-08-26 DIAGNOSIS — I119 Hypertensive heart disease without heart failure: Secondary | ICD-10-CM | POA: Diagnosis not present

## 2019-08-26 DIAGNOSIS — I272 Pulmonary hypertension, unspecified: Secondary | ICD-10-CM | POA: Diagnosis not present

## 2019-08-26 DIAGNOSIS — E785 Hyperlipidemia, unspecified: Secondary | ICD-10-CM

## 2019-08-26 DIAGNOSIS — I313 Pericardial effusion (noninflammatory): Secondary | ICD-10-CM | POA: Insufficient documentation

## 2019-08-26 DIAGNOSIS — Z951 Presence of aortocoronary bypass graft: Secondary | ICD-10-CM | POA: Insufficient documentation

## 2019-08-26 LAB — ECHOCARDIOGRAM COMPLETE
Height: 70 in
Weight: 2944 oz

## 2019-08-26 NOTE — Progress Notes (Signed)
Cardiology Office Note:    Date:  08/26/2019   ID:  Louis Houston, DOB 1932-04-03, MRN [id number]  PCP:  Nicoletta Dress, MD  Cardiologist:  Jenne Campus, MD    Referring MD: Nicoletta Dress, MD   Chief Complaint  Patient presents with  . Follow-up  I am short of breath  History of Present Illness:    Louis Houston is a 86 y.o. male with past medical history significant for coronary artery disease, he does have pulmonary hypertension with latest estimation more than a year ago pulmonary pressure 50 to 60 mmHg, also hypertension dyslipidemia.  He been gradually getting more short of breath.  I did talk to his daughter as well who joined [place] during the visit.  Described to have some swelling of lower extremities and evening time, no proximal nocturnal dyspnea.  Denies have any chest pain tightness squeezing pressure burning chest chest shortness of breath.  History reviewed. No pertinent past medical history.  Past Surgical History:  Procedure Laterality Date  . CARDIAC SURGERY    . GALLBLADDER SURGERY    . PROSTATE SURGERY      Current Medications: Current Meds  Medication Sig  . amLODipine (NORVASC) 10 MG tablet Take 10 mg by mouth daily.  Marland Kitchen aspirin 81 MG EC tablet Take 81 mg by mouth daily.  . furosemide (LASIX) 40 MG tablet Take 1 tablet by mouth daily.  Marland Kitchen glipiZIDE (GLUCOTROL) 5 MG tablet Take 1 tablet by mouth 2 (two) times daily.  . hydrALAZINE (APRESOLINE) 25 MG tablet Take 25 mg by mouth 2 (two) times daily.   Marland Kitchen lisinopril (PRINIVIL,ZESTRIL) 40 MG tablet Take 1 tablet by mouth daily.  . metFORMIN (GLUCOPHAGE) 500 MG tablet Take 1 tablet by mouth daily.  . metoprolol tartrate (LOPRESSOR) 50 MG tablet Take 1 tablet by mouth 2 (two) times daily.  . Multiple Vitamin (MULTI-VITAMINS) TABS Take 1 tablet by mouth daily.  . potassium chloride (K-DUR) 10 MEQ tablet Take 1 tablet by mouth daily.  . simvastatin (ZOCOR) 40 MG tablet Take 1 tablet by mouth daily.      Allergies:   Patient has no known allergies.   Social History   Socioeconomic History  . Marital status: Widowed    Spouse name: Not on file  . Number of children: Not on file  . Years of education: Not on file  . Highest education level: Not on file  Occupational History  . Not on file  Tobacco Use  . Smoking status: Former Research scientist (life sciences)  . Smokeless tobacco: Never Used  Substance and Sexual Activity  . Alcohol use: Not on file  . Drug use: Not on file  . Sexual activity: Not on file  Other Topics Concern  . Not on file  Social History Narrative  . Not on file   Social Determinants of Health   Financial Resource Strain:   . Difficulty of Paying Living Expenses:   Food Insecurity:   . Worried About Charity fundraiser in the Last Year:   . Arboriculturist in the Last Year:   Transportation Needs:   . Film/video editor (Medical):   Marland Kitchen Lack of Transportation (Non-Medical):   Physical Activity:   . Days of Exercise per Week:   . Minutes of Exercise per Session:   Stress:   . Feeling of Stress :   Social Connections:   . Frequency of Communication with Friends and Family:   . Frequency of Social Gatherings  with Friends and Family:   . Attends Religious Services:   . Active Member of Clubs or Organizations:   . Attends Archivist Meetings:   Marland Kitchen Marital Status:      Family History: The patient's family history includes Diabetes in his mother and sister. ROS:   Please see the history of present illness.    All 14 point review of systems negative except as described per history of present illness  EKGs/Labs/Other Studies Reviewed:    His EKG today showed normal sinus rhythm, right bundle branch block which is new, nonspecific ST segment changes  Recent Labs: No results found for requested labs within last 8760 hours.  Recent Lipid Panel No results found for: CHOL, TRIG, HDL, CHOLHDL, VLDL, LDLCALC, LDLDIRECT  Physical Exam:    VS:  BP 102/72   Pulse  63   Ht _0  (1.778 m)   Wt 184 lb (83.5 kg)   SpO2 91%   BMI 26.40 kg/m     Wt Readings from Last 3 Encounters:  08/26/19 184 lb (83.5 kg)  09/06/18 185 lb 3.2 oz (84 kg)  03/11/18 184 lb 9.6 oz (83.7 kg)     GEN:  Well nourished, well developed in no acute distress HEENT: Normal NECK: No JVD; No carotid bruits LYMPHATICS: No lymphadenopathy CARDIAC: RRR, no murmurs, no rubs, no gallops RESPIRATORY:  Clear to auscultation without rales, wheezing or rhonchi  ABDOMEN: Soft, non-tender, non-distended MUSCULOSKELETAL:  No edema; No deformity  SKIN: Warm and dry LOWER EXTREMITIES: no swelling NEUROLOGIC:  Alert and oriented x 3 PSYCHIATRIC:  Normal affect   ASSESSMENT:    1. Status post coronary artery bypass graft   2. Dyspnea on exertion   3. Dyslipidemia   4. Pulmonary hypertension ([place])   5. Essential hypertension   6. Coronary artery disease involving native coronary artery of native heart without angina pectoris    PLAN:    In order of problems listed above:  1. Status post coronary artery bypass graft denies having any chest pain but obviously short of breath we may need to do some investigation for coronary artery disease. 2. Dyspnea on exertion echocardiogram will be done today.  I will check his Chem-7 as well as proBNP to help with the therapy.  I will also check his oxygen while walking on the hallway he may require oxygen at home. 3. Dyslipidemia I have his K PN which only he is HDL of 39 and LDL of 57 this is from March 19 of this year he is already on simvastatin 40 which is moderate intensity statin.  We will continue for now. 4. Essential hypertension blood pressure is actually on the lower side well controlled continue present management.  In the future if we plan to put him on a higher dose of diuretic he will require some discontinuation of the medication. 5. I am worried about him he does have pulmonary hypertension echocardiogram will be done today to  recheck his pulmonary pressure.  We did check his oxygen.  Her oxygen on room air was 93% saturation, walking 93%, sitting after walking his oxygen saturation dropped to 84%.  He is oxygen saturation on 2 L of oxygen was 100%   Medication Adjustments/Labs and Tests Ordered: Current medicines are reviewed at length with the patient today.  Concerns regarding medicines are outlined above.  No orders of the defined types were placed in this encounter.  Medication changes: No orders of the defined types were placed in  this encounter.   Signed, Park Liter, MD, [facility] 08/26/2019 10:19 AM    [place]

## 2019-08-26 NOTE — Telephone Encounter (Signed)
Called pt's daughter and lvm per (DPR) to give FYI faxing the note to [facility] for 02 for pt.  S/W Liliane Channel @ [phone] stated to fax all documentation from todays visit to [phone]. Will send fax this pm.

## 2019-08-26 NOTE — Patient Instructions (Addendum)
Medication Instructions:  Your physician recommends that you continue on your current medications as directed. Please refer to the Current Medication list given to you today.   Labwork: Your physician recommends that you have lab work today: pro bnp/bmet   Testing/Procedures: Today at 1:15 at [facility].  Go to entrance [address] on [address] pass the ER.    Your physician has requested that you have an echocardiogram. Echocardiography is a painless test that uses sound waves to create images of your heart. It provides your doctor with information about the size and shape of your heart and how well your heart's chambers and valves are working. This procedure takes approximately one hour. There are no restrictions for this procedure.   Echocardiogram An echocardiogram is a procedure that uses painless sound waves (ultrasound) to produce an image of the heart. Images from an echocardiogram can provide important information about:  Signs of coronary artery disease (CAD).  Aneurysm detection. An aneurysm is a weak or damaged part of an artery wall that bulges out from the normal force of blood pumping through the body.  Heart size and shape. Changes in the size or shape of the heart can be associated with certain conditions, including heart failure, aneurysm, and CAD.  Heart muscle function.  Heart valve function.  Signs of a past heart attack.  Fluid buildup around the heart.  Thickening of the heart muscle.  A tumor or infectious growth around the heart valves. Tell a health care provider about:  Any allergies you have.  All medicines you are taking, including vitamins, herbs, eye drops, creams, and over-the-counter medicines.  Any blood disorders you have.  Any surgeries you have had.  Any medical conditions you have.  Whether you are pregnant or may be pregnant. What are the risks? Generally, this is a safe procedure. However, problems may occur, including:  Allergic  reaction to dye (contrast) that may be used during the procedure. What happens before the procedure? No specific preparation is needed. You may eat and drink normally. What happens during the procedure?   An IV tube may be inserted into one of your veins.  You may receive contrast through this tube. A contrast is an injection that improves the quality of the pictures from your heart.  A gel will be applied to your chest.  A wand-like tool (transducer) will be moved over your chest. The gel will help to transmit the sound waves from the transducer.  The sound waves will harmlessly bounce off of your heart to allow the heart images to be captured in real-time motion. The images will be recorded on a computer. The procedure may vary among health care providers and hospitals. What happens after the procedure?  You may return to your normal, everyday life, including diet, activities, and medicines, unless your health care provider tells you not to do that. Summary  An echocardiogram is a procedure that uses painless sound waves (ultrasound) to produce an image of the heart.  Images from an echocardiogram can provide important information about the size and shape of your heart, heart muscle function, heart valve function, and fluid buildup around your heart.  You do not need to do anything to prepare before this procedure. You may eat and drink normally.  After the echocardiogram is completed, you may return to your normal, everyday life, unless your health care provider tells you not to do that. This information is not intended to replace advice given to you by your health care  provider. Make sure you discuss any questions you have with your health care provider. Document Revised: 08/12/2018 Document Reviewed: 05/24/2016 Elsevier Patient Education  [place]: Your physician recommends that you keep your scheduled  follow-up appointment with Dr. Fraser Din in [facility].    Any Other Special Instructions Will Be Listed Below (If Applicable).  You will hear from [facility] about getting oxygen.   If you need a refill on your cardiac medications before your next appointment, please call your pharmacy.

## 2019-08-26 NOTE — Progress Notes (Signed)
  Echocardiogram 2D Echocardiogram has been performed.  Matilde Bash 08/26/2019, 1:45 PM

## 2019-08-27 LAB — BASIC METABOLIC PANEL
BUN/Creatinine Ratio: 22 (ref 10–24)
BUN: 25 mg/dL (ref 8–27)
CO2: 24 mmol/L (ref 20–29)
Calcium: 10.1 mg/dL (ref 8.6–10.2)
Chloride: 101 mmol/L (ref 96–106)
Creatinine, Ser: 1.16 mg/dL (ref 0.76–1.27)
GFR calc Af Amer: 66 mL/min/{1.73_m2} (ref >59)
GFR calc non Af Amer: 57 mL/min/{1.73_m2} — ABNORMAL LOW (ref >59)
Glucose: 106 mg/dL — ABNORMAL HIGH (ref 65–99)
Potassium: 3.8 mmol/L (ref 3.5–5.2)
Sodium: 142 mmol/L (ref 134–144)

## 2019-08-27 LAB — PRO B NATRIURETIC PEPTIDE: NT-Pro BNP: 2010 pg/mL — ABNORMAL HIGH (ref 0–486)

## 2019-08-29 ENCOUNTER — Telehealth: Payer: Self-pay | Admitting: Cardiology

## 2019-08-29 NOTE — Telephone Encounter (Signed)
Do you know anything about this?

## 2019-08-29 NOTE — Telephone Encounter (Signed)
Called [facility]. Was on the phone for 7 minutes and then was told that Bethanne Ginger would call me back.

## 2019-08-29 NOTE — Telephone Encounter (Signed)
Called [facility] back. They report that Dr. Wendy Poet note is not sufficient because it reads that the patient was "sitting at room air 84%" it needs to state standing/walking he was 84% on room for him to be able to receive and his insurance to cover. Will consult with DOD's since Dr. Agustin Cree is out of the office this week.

## 2019-08-29 NOTE — Telephone Encounter (Signed)
New Message   Gilda from [facility] is calling about an Oxygen order. They need the oxygen testing corrected before they can deliver the equpiment  The office is closed 12-1 for lunch     Please call back

## 2019-08-29 NOTE — Telephone Encounter (Signed)
Louis Houston from [facility] is returning Louis Houston's call. She states the appointment notes from 08/26/19 need to be amended for testing to 84% room air at rest.

## 2019-08-30 ENCOUNTER — Telehealth: Payer: Self-pay | Admitting: Emergency Medicine

## 2019-08-30 DIAGNOSIS — R0609 Other forms of dyspnea: Secondary | ICD-10-CM

## 2019-08-30 DIAGNOSIS — I10 Essential (primary) hypertension: Secondary | ICD-10-CM

## 2019-08-30 MED ORDER — FUROSEMIDE 40 MG PO TABS: 40.0000 mg | ORAL_TABLET | Freq: Two times a day (BID) | ORAL | 1 refills | Status: DC

## 2019-08-30 MED ORDER — POTASSIUM CHLORIDE ER 10 MEQ PO TBCR: 10.0000 meq | EXTENDED_RELEASE_TABLET | Freq: Two times a day (BID) | ORAL | 1 refills | Status: DC

## 2019-08-30 NOTE — Telephone Encounter (Signed)
Called Gilda with [facility] again. She is now stating that because Dr. Wendy Poet note about ambulating and oxygen saturation will not be accepted because when it says the patient's oxygen dropped to 84% and doesn't have room air it will not qualify him. A new note will need to added with room air behind 84% or Dr. Agustin Cree will have to addend his note.   After speaking with DOD yesterday Dr. Harriet Masson advised this been addressed with pcp. I will reach out to the covering cma to see if she will put in a new note that has the information needed on it if not I will advise patient to get set up with pcp.

## 2019-08-30 NOTE — Addendum Note (Signed)
Addended by: Ashok Norris on: 08/30/2019 11:53 AM   Modules accepted: Orders

## 2019-08-30 NOTE — Telephone Encounter (Signed)
Gilda from [facility] called back this morning. She would like Hayley to do a nurse note for the O2 testing. Please call her if further clarification is needed

## 2019-08-30 NOTE — Telephone Encounter (Signed)
Called patient informed him of lab results and echocardiogram results. Advised him to increase lasix to 40 mg twice daily and potassium 10 meq twice daily per dr. Agustin Cree. Patient also advised to have labs drawn in 1 week. He verbally understood no further questions.

## 2019-08-30 NOTE — Telephone Encounter (Signed)
Called patient per Janan Halter, RN informed him to save time it would be best for him to contact pcp to get his oxygen intiated through them. I explained we were trying to do it but they want notes changed and with Dr. Agustin Cree out this week we didn't want to prolong it anymore. Patient verbally understood and he will reach out to his pcp.

## 2019-08-31 ENCOUNTER — Telehealth: Payer: Self-pay | Admitting: Cardiology

## 2019-08-31 NOTE — Telephone Encounter (Signed)
Called and spoke to [place] and patient's pcp office informed them we have not been successful in getting oxygen for the patient and that we were asking them to further assist the patient since Dr. Agustin Cree is out for the rest of the week. Polly agreed to help. Will fax last office visit not to [place] with Dr. Carolanne Grumbling office.

## 2019-08-31 NOTE — Telephone Encounter (Signed)
New message:    Malachy Mood calling fro [facility] some orders for this patient oxygen. Please call back. [phone]

## 2019-08-31 NOTE — Telephone Encounter (Signed)
Attempted to call cherly back. No answer will continue efforts.

## 2019-09-01 DIAGNOSIS — I270 Primary pulmonary hypertension: Secondary | ICD-10-CM | POA: Diagnosis not present

## 2019-09-01 DIAGNOSIS — G4733 Obstructive sleep apnea (adult) (pediatric): Secondary | ICD-10-CM | POA: Diagnosis not present

## 2019-09-01 DIAGNOSIS — I2510 Atherosclerotic heart disease of native coronary artery without angina pectoris: Secondary | ICD-10-CM | POA: Diagnosis not present

## 2019-09-01 DIAGNOSIS — I10 Essential (primary) hypertension: Secondary | ICD-10-CM | POA: Diagnosis not present

## 2019-09-01 NOTE — Telephone Encounter (Signed)
Called and spoke to patient's daughter per dpr. Informed her of the patient's results and recent medication changes. She verbally understood no further questions.

## 2019-09-01 NOTE — Telephone Encounter (Signed)
Follow Up   Patient's daughter is calling in to go over results and medication changes with nurse. States that patient is confused and didn't understand when they were gone over with him. Please give a call back.

## 2019-09-06 DIAGNOSIS — I10 Essential (primary) hypertension: Secondary | ICD-10-CM | POA: Diagnosis not present

## 2019-09-06 DIAGNOSIS — R0600 Dyspnea, unspecified: Secondary | ICD-10-CM | POA: Diagnosis not present

## 2019-09-06 DIAGNOSIS — I270 Primary pulmonary hypertension: Secondary | ICD-10-CM | POA: Diagnosis not present

## 2019-09-06 DIAGNOSIS — J449 Chronic obstructive pulmonary disease, unspecified: Secondary | ICD-10-CM | POA: Diagnosis not present

## 2019-09-07 LAB — BASIC METABOLIC PANEL
BUN/Creatinine Ratio: 23 (ref 10–24)
BUN: 27 mg/dL (ref 8–27)
CO2: 27 mmol/L (ref 20–29)
Calcium: 9.8 mg/dL (ref 8.6–10.2)
Chloride: 98 mmol/L (ref 96–106)
Creatinine, Ser: 1.17 mg/dL (ref 0.76–1.27)
GFR calc Af Amer: 65 mL/min/{1.73_m2} (ref >59)
GFR calc non Af Amer: 57 mL/min/{1.73_m2} — ABNORMAL LOW (ref >59)
Glucose: 87 mg/dL (ref 65–99)
Potassium: 3.9 mmol/L (ref 3.5–5.2)
Sodium: 141 mmol/L (ref 134–144)

## 2019-09-07 LAB — PRO B NATRIURETIC PEPTIDE: NT-Pro BNP: 2176 pg/mL — ABNORMAL HIGH (ref 0–486)

## 2019-09-08 NOTE — Telephone Encounter (Signed)
Attempted to call back. Was on hold for over 6 minutes had to hang up due to patients in the clinic. Will continue efforts.

## 2019-09-09 ENCOUNTER — Ambulatory Visit: Payer: Medicare Other | Admitting: Cardiology

## 2019-09-12 ENCOUNTER — Other Ambulatory Visit: Payer: Self-pay

## 2019-09-12 ENCOUNTER — Ambulatory Visit: Payer: Medicare Other | Admitting: Cardiology

## 2019-09-12 ENCOUNTER — Encounter: Payer: Self-pay | Admitting: Cardiology

## 2019-09-12 VITALS — BP 120/62 | HR 64 | Ht 70.0 in | Wt 184.0 lb

## 2019-09-12 DIAGNOSIS — I10 Essential (primary) hypertension: Secondary | ICD-10-CM | POA: Diagnosis not present

## 2019-09-12 DIAGNOSIS — G4733 Obstructive sleep apnea (adult) (pediatric): Secondary | ICD-10-CM | POA: Diagnosis not present

## 2019-09-12 DIAGNOSIS — R0600 Dyspnea, unspecified: Secondary | ICD-10-CM

## 2019-09-12 DIAGNOSIS — Z951 Presence of aortocoronary bypass graft: Secondary | ICD-10-CM

## 2019-09-12 DIAGNOSIS — R0609 Other forms of dyspnea: Secondary | ICD-10-CM

## 2019-09-12 DIAGNOSIS — I2510 Atherosclerotic heart disease of native coronary artery without angina pectoris: Secondary | ICD-10-CM | POA: Diagnosis not present

## 2019-09-12 DIAGNOSIS — E785 Hyperlipidemia, unspecified: Secondary | ICD-10-CM

## 2019-09-12 DIAGNOSIS — I272 Pulmonary hypertension, unspecified: Secondary | ICD-10-CM

## 2019-09-12 NOTE — Progress Notes (Signed)
Cardiology Office Note:    Date:  09/12/2019   ID:  Louis Houston, DOB February 26, 1932, MRN [id number]  PCP:  Louis Dress, MD  Cardiologist:  Louis Campus, MD    Referring MD: Louis Dress, MD   Chief Complaint  Patient presents with  . Follow-up  Still short of breath  History of Present Illness:    Louis Houston is a 86 y.o. male past medical history significant for coronary artery disease, status post coronary bypass graft years ago.  I been following for years he has been stable does have baseline elevation of pulmonary artery pressure with pulmonary artery pressure in the neighborhood of 50 to 60 mmHg.  Has been stable for years however lately he started experiencing more shortness of breath.  I did echocardiogram and echocardiogram showed worsening of his pulmonary pressure now we talking about 80 mmHg.  He also was find to have elevated proBNP I initiated diuresis with limited success.  His primary care physician also did CT of his chest looking for pulmonary emboli, likely that came negative.  There was evidence of borderline main pulmonary artery enlargements measuring 3.2 raising suspicion for pulmonary hypertension.  There were no evidence of pulmonary emboli.  In spite of slightly more aggressive diuresis he still not doing well swelling of lower extremity improved but shortness of breath is still there.  His primary care physician also did pulmonary function test which showed moderate severe obstructive airways disease and he was given some bronchodilator. Denies have any chest pain, tightness, pressure, burning in the chest.  History reviewed. No pertinent past medical history.  Past Surgical History:  Procedure Laterality Date  . CARDIAC SURGERY    . GALLBLADDER SURGERY    . PROSTATE SURGERY      Current Medications: Current Meds  Medication Sig  . amLODipine (NORVASC) 10 MG tablet Take 10 mg by mouth daily.  Marland Kitchen aspirin 81 MG EC tablet Take 81 mg by mouth  daily.  . furosemide (LASIX) 40 MG tablet Take 1 tablet (40 mg total) by mouth 2 (two) times daily.  Marland Kitchen glipiZIDE (GLUCOTROL) 5 MG tablet Take 1 tablet by mouth 2 (two) times daily.  . hydrALAZINE (APRESOLINE) 25 MG tablet Take 25 mg by mouth 2 (two) times daily.   Marland Kitchen lisinopril (PRINIVIL,ZESTRIL) 40 MG tablet Take 1 tablet by mouth daily.  . metFORMIN (GLUCOPHAGE) 500 MG tablet Take 1 tablet by mouth daily.  . metoprolol tartrate (LOPRESSOR) 50 MG tablet Take 1 tablet by mouth 2 (two) times daily.  . Multiple Vitamin (MULTI-VITAMINS) TABS Take 1 tablet by mouth daily.  . potassium chloride (KLOR-CON) 10 MEQ tablet Take 1 tablet (10 mEq total) by mouth 2 (two) times daily.  . simvastatin (ZOCOR) 40 MG tablet Take 1 tablet by mouth daily.  Marland Kitchen SPIRIVA HANDIHALER 18 MCG inhalation capsule 1 capsule daily.     Allergies:   Patient has no known allergies.   Social History   Socioeconomic History  . Marital status: Widowed    Spouse name: Not on file  . Number of children: Not on file  . Years of education: Not on file  . Highest education level: Not on file  Occupational History  . Not on file  Tobacco Use  . Smoking status: Former Research scientist (life sciences)  . Smokeless tobacco: Never Used  Substance and Sexual Activity  . Alcohol use: Not on file  . Drug use: Not on file  . Sexual activity: Not on file  Other  Topics Concern  . Not on file  Social History Narrative  . Not on file   Social Determinants of Health   Financial Resource Strain:   . Difficulty of Paying Living Expenses:   Food Insecurity:   . Worried About Charity fundraiser in the Last Year:   . Arboriculturist in the Last Year:   Transportation Needs:   . Film/video editor (Medical):   Marland Kitchen Lack of Transportation (Non-Medical):   Physical Activity:   . Days of Exercise per Week:   . Minutes of Exercise per Session:   Stress:   . Feeling of Stress :   Social Connections:   . Frequency of Communication with Friends and Family:    . Frequency of Social Gatherings with Friends and Family:   . Attends Religious Services:   . Active Member of Clubs or Organizations:   . Attends Archivist Meetings:   Marland Kitchen Marital Status:      Family History: The patient's family history includes Diabetes in his mother and sister. ROS:   Please see the history of present illness.    All 14 point review of systems negative except as described per history of present illness  EKGs/Labs/Other Studies Reviewed:      Recent Labs: 09/06/2019: BUN 27; Creatinine, Ser 1.17; NT-Pro BNP 2,176; Potassium 3.9; Sodium 141  Recent Lipid Panel No results found for: CHOL, TRIG, HDL, CHOLHDL, VLDL, LDLCALC, LDLDIRECT  Physical Exam:    VS:  BP 120/62   Pulse 64   Ht _0  (1.778 m)   Wt 184 lb (83.5 kg)   SpO2 92%   BMI 26.40 kg/m     Wt Readings from Last 3 Encounters:  09/12/19 184 lb (83.5 kg)  08/26/19 184 lb (83.5 kg)  09/06/18 185 lb 3.2 oz (84 kg)     GEN:  Well nourished, well developed in no acute distress HEENT: Normal NECK: No JVD; No carotid bruits LYMPHATICS: No lymphadenopathy CARDIAC: RRR, no murmurs, no rubs, no gallops RESPIRATORY:  Clear to auscultation without rales, wheezing or rhonchi  ABDOMEN: Soft, non-tender, non-distended MUSCULOSKELETAL:  No edema; No deformity  SKIN: Warm and dry LOWER EXTREMITIES: Minimal swelling NEUROLOGIC:  Alert and oriented x 3 PSYCHIATRIC:  Normal affect   ASSESSMENT:    1. Pulmonary hypertension (HCC)   2. Coronary artery disease involving native coronary artery of native heart without angina pectoris   3. Status post coronary artery bypass graft   4. Essential hypertension   5. Obstructive sleep apnea   6. Dyslipidemia   7. Dyspnea on exertion    PLAN:    In order of problems listed above:  1. Pulmonary hypertension with pulmonary pressure going up.  Echocardiogram done recently showed preserved left ventricle ejection fraction however pulmonary pressure  is now at the level of 81 mmHg.  CT of his chest has been done which showed no evidence of PE, pulmonary function test showed some moderate severe obstructive airways disease, apparently he does have diagnosis of obstructive sleep apnea but does not use CPAP mask.  I will refer her to our pulmonary hypertension clinic for evaluation. 2. Coronary disease stable denies have any issues.  Continue present management.  Status post coronary artery bypass graft.  Noted 3. Essential hypertension: Blood pressure 120/62 we will continue present management. 4. Obstructive sleep apnea follow-up by his primary care physician. 5. Dyslipidemia: He is on moderate intensity statin.  Will call his primary care physician to  get fasting lipid profile 6. Dyspnea exertion: Multifactorial.  Trial of diuresis improved swelling however shortness of breath is still there.  He may require more intense diuresis.  I will wait for his Chem-7 to be back to decide.   Medication Adjustments/Labs and Tests Ordered: Current medicines are reviewed at length with the patient today.  Concerns regarding medicines are outlined above.  Orders Placed This Encounter  Procedures  . AMB referral to CHF clinic   Medication changes: No orders of the defined types were placed in this encounter.   Signed, Park Liter, MD, [facility] 09/12/2019 12:00 PM    [place]

## 2019-09-12 NOTE — Patient Instructions (Addendum)
Medication Instructions:  Your physician recommends that you continue on your current medications as directed. Please refer to the Current Medication list given to you today.  *If you need a refill on your cardiac medications before your next appointment, please call your pharmacy*   Lab Work: None.  If you have labs (blood work) drawn today and your tests are completely normal, you will receive your results only by: Marland Kitchen MyChart Message (if you have MyChart) OR . A paper copy in the mail If you have any lab test that is abnormal or we need to change your treatment, we will call you to review the results.   Testing/Procedures: None.    Follow-Up: At [facility], you and your health needs are our priority.  As part of our continuing mission to provide you with exceptional heart care, we have created designated Provider Care Teams.  These Care Teams include your primary Cardiologist (physician) and Advanced Practice Providers (APPs -  Physician Assistants and Nurse Practitioners) who all work together to provide you with the care you need, when you need it.  We recommend signing up for the patient portal called "MyChart".  Sign up information is provided on this After Visit Summary.  MyChart is used to connect with patients for Virtual Visits (Telemedicine).  Patients are able to view lab/test results, encounter notes, upcoming appointments, etc.  Non-urgent messages can be sent to your provider as well.   To learn more about what you can do with MyChart, go to [email or web address].    Your next appointment:   2 month(s)  The format for your next appointment:   In Person  Provider:   Jenne Campus, MD   Other Instructions  Dr. Agustin Cree has referred you to Dr. Haroldine Laws for pulmonary hypertension they should be calling you within a week for a appointment if not please call our office.

## 2019-09-14 NOTE — Telephone Encounter (Signed)
Tried to call again was on hold for 8 minutes with no answer. We are no longer ordering his oxygen his pcp was informed of this and they were taking over this. Will remove from work que.

## 2019-09-26 ENCOUNTER — Ambulatory Visit: Payer: Medicare Other | Admitting: Cardiology

## 2019-10-01 DIAGNOSIS — G4733 Obstructive sleep apnea (adult) (pediatric): Secondary | ICD-10-CM | POA: Diagnosis not present

## 2019-10-01 DIAGNOSIS — I270 Primary pulmonary hypertension: Secondary | ICD-10-CM | POA: Diagnosis not present

## 2019-10-01 DIAGNOSIS — I10 Essential (primary) hypertension: Secondary | ICD-10-CM | POA: Diagnosis not present

## 2019-10-01 DIAGNOSIS — I2510 Atherosclerotic heart disease of native coronary artery without angina pectoris: Secondary | ICD-10-CM | POA: Diagnosis not present

## 2019-10-10 ENCOUNTER — Telehealth: Payer: Self-pay | Admitting: Cardiology

## 2019-10-10 NOTE — Telephone Encounter (Signed)
[place] from [facility] requesting a medication list be faxed to them, because the patient is switching pharmacies. Fax: [phone]

## 2019-10-10 NOTE — Telephone Encounter (Signed)
Med list faxed to [facility].

## 2019-10-24 DIAGNOSIS — I10 Essential (primary) hypertension: Secondary | ICD-10-CM | POA: Diagnosis not present

## 2019-10-24 DIAGNOSIS — J449 Chronic obstructive pulmonary disease, unspecified: Secondary | ICD-10-CM | POA: Diagnosis not present

## 2019-10-24 DIAGNOSIS — Z79899 Other long term (current) drug therapy: Secondary | ICD-10-CM | POA: Diagnosis not present

## 2019-10-24 DIAGNOSIS — E785 Hyperlipidemia, unspecified: Secondary | ICD-10-CM | POA: Diagnosis not present

## 2019-10-24 DIAGNOSIS — E1169 Type 2 diabetes mellitus with other specified complication: Secondary | ICD-10-CM | POA: Diagnosis not present

## 2019-10-24 DIAGNOSIS — I2510 Atherosclerotic heart disease of native coronary artery without angina pectoris: Secondary | ICD-10-CM | POA: Diagnosis not present

## 2019-11-01 DIAGNOSIS — I270 Primary pulmonary hypertension: Secondary | ICD-10-CM | POA: Diagnosis not present

## 2019-11-01 DIAGNOSIS — I10 Essential (primary) hypertension: Secondary | ICD-10-CM | POA: Diagnosis not present

## 2019-11-01 DIAGNOSIS — G4733 Obstructive sleep apnea (adult) (pediatric): Secondary | ICD-10-CM | POA: Diagnosis not present

## 2019-11-01 DIAGNOSIS — I2510 Atherosclerotic heart disease of native coronary artery without angina pectoris: Secondary | ICD-10-CM | POA: Diagnosis not present

## 2019-11-09 ENCOUNTER — Emergency Department (HOSPITAL_COMMUNITY): Payer: Medicare Other

## 2019-11-09 ENCOUNTER — Other Ambulatory Visit: Payer: Self-pay

## 2019-11-09 ENCOUNTER — Emergency Department (HOSPITAL_COMMUNITY)
Admission: EM | Admit: 2019-11-09 | Discharge: 2019-11-09 | Disposition: A | Discharge status: Home / Self Care | Payer: Medicare Other | Attending: Emergency Medicine | Admitting: Emergency Medicine

## 2019-11-09 ENCOUNTER — Encounter (HOSPITAL_COMMUNITY): Payer: Self-pay | Admitting: Emergency Medicine

## 2019-11-09 DIAGNOSIS — Z87891 Personal history of nicotine dependence: Secondary | ICD-10-CM | POA: Insufficient documentation

## 2019-11-09 DIAGNOSIS — Z79899 Other long term (current) drug therapy: Secondary | ICD-10-CM | POA: Insufficient documentation

## 2019-11-09 DIAGNOSIS — I2510 Atherosclerotic heart disease of native coronary artery without angina pectoris: Secondary | ICD-10-CM | POA: Diagnosis not present

## 2019-11-09 DIAGNOSIS — I10 Essential (primary) hypertension: Secondary | ICD-10-CM | POA: Insufficient documentation

## 2019-11-09 DIAGNOSIS — R339 Retention of urine, unspecified: Secondary | ICD-10-CM | POA: Insufficient documentation

## 2019-11-09 DIAGNOSIS — Z951 Presence of aortocoronary bypass graft: Secondary | ICD-10-CM | POA: Diagnosis not present

## 2019-11-09 DIAGNOSIS — N3289 Other specified disorders of bladder: Secondary | ICD-10-CM | POA: Diagnosis not present

## 2019-11-09 DIAGNOSIS — Z466 Encounter for fitting and adjustment of urinary device: Secondary | ICD-10-CM | POA: Diagnosis not present

## 2019-11-09 DIAGNOSIS — Z7982 Long term (current) use of aspirin: Secondary | ICD-10-CM | POA: Insufficient documentation

## 2019-11-09 DIAGNOSIS — R310 Gross hematuria: Secondary | ICD-10-CM | POA: Insufficient documentation

## 2019-11-09 DIAGNOSIS — K7689 Other specified diseases of liver: Secondary | ICD-10-CM | POA: Diagnosis not present

## 2019-11-09 DIAGNOSIS — R338 Other retention of urine: Secondary | ICD-10-CM | POA: Diagnosis not present

## 2019-11-09 DIAGNOSIS — N35911 Unspecified urethral stricture, male, meatal: Secondary | ICD-10-CM | POA: Diagnosis not present

## 2019-11-09 DIAGNOSIS — N281 Cyst of kidney, acquired: Secondary | ICD-10-CM | POA: Diagnosis not present

## 2019-11-09 DIAGNOSIS — N471 Phimosis: Secondary | ICD-10-CM | POA: Diagnosis not present

## 2019-11-09 LAB — URINALYSIS, ROUTINE W REFLEX MICROSCOPIC

## 2019-11-09 LAB — CBC
HCT: 37.5 % — ABNORMAL LOW (ref 39.0–52.0)
Hemoglobin: 12.7 g/dL — ABNORMAL LOW (ref 13.0–17.0)
MCH: 31.1 pg (ref 26.0–34.0)
MCHC: 33.9 g/dL (ref 30.0–36.0)
MCV: 91.9 fL (ref 80.0–100.0)
Platelets: 223 10*3/uL (ref 150–400)
RBC: 4.08 MIL/uL — ABNORMAL LOW (ref 4.22–5.81)
RDW: 13.9 % (ref 11.5–15.5)
WBC: 9.2 10*3/uL (ref 4.0–10.5)
nRBC: 0 % (ref 0.0–0.2)

## 2019-11-09 LAB — BASIC METABOLIC PANEL
Anion gap: 13 (ref 5–15)
BUN: 28 mg/dL — ABNORMAL HIGH (ref 8–23)
CO2: 24 mmol/L (ref 22–32)
Calcium: 9.6 mg/dL (ref 8.9–10.3)
Chloride: 100 mmol/L (ref 98–111)
Creatinine, Ser: 1.3 mg/dL — ABNORMAL HIGH (ref 0.61–1.24)
GFR calc Af Amer: 58 mL/min — ABNORMAL LOW (ref >60)
GFR calc non Af Amer: 50 mL/min — ABNORMAL LOW (ref >60)
Glucose, Bld: 176 mg/dL — ABNORMAL HIGH (ref 70–99)
Potassium: 3.8 mmol/L (ref 3.5–5.1)
Sodium: 137 mmol/L (ref 135–145)

## 2019-11-09 LAB — URINALYSIS, MICROSCOPIC (REFLEX): RBC / HPF: >50 RBC/hpf (ref 0–5)

## 2019-11-09 LAB — CBG MONITORING, ED
Glucose-Capillary: 171 mg/dL — ABNORMAL HIGH (ref 70–99)
Glucose-Capillary: 195 mg/dL — ABNORMAL HIGH (ref 70–99)

## 2019-11-09 MED ORDER — LIDOCAINE HCL URETHRAL/MUCOSAL 2 % EX GEL
1.0000 "application " | Freq: Once | CUTANEOUS | Status: DC
  Filled 2019-11-09: qty 11

## 2019-11-09 MED ORDER — SODIUM CHLORIDE 0.9 % IV SOLN
1.0000 g | Freq: Once | INTRAVENOUS | Status: AC
  Administered 2019-11-09: 1 g via INTRAVENOUS
  Filled 2019-11-09: qty 10

## 2019-11-09 NOTE — Discharge Instructions (Signed)
1. You may see some blood in the urine and may have some burning with urination for 48-72 hours. You also may notice that you have to urinate more frequently or urgently after your procedure which is normal.  2. You should call should you develop an inability urinate, fever > 101, persistent nausea and vomiting that prevents you from eating or drinking to stay hydrated.  3. If you have a catheter, you will be taught how to take care of the catheter by the nursing staff prior to discharge from the hospital.  You may periodically feel a strong urge to void with the catheter in place.  This is a bladder spasm and most often can occur when having a bowel movement or moving around. It is typically self-limited and usually will stop after a few minutes.  You may use some Vaseline or Neosporin around the tip of the catheter to reduce friction at the tip of the penis. You may also see some blood in the urine.  A very small amount of blood can make the urine look quite red.  As long as the catheter is draining well, there usually is not a problem.  However, if the catheter is not draining well and is bloody, you should call the office [phone]) to notify us.

## 2019-11-09 NOTE — Consult Note (Signed)
Urology Consult  Consulting MD: Veryl Speak  CC: Gross hematuria, clot retention, inability to catheterize patient  HPI: This is a 86year old male presenting to the emergency room early this morning with gross hematuria and inability to void.  The patient does have a remote history of TURP by a urologist in [facility] who has recently retired.  Evaluation here has revealed the patient to have a large clot of blood in the bladder which is distended.  Attempts at catheter placement x2 have been unsuccessful.  Urologic consultation is requested.  The patient denies longstanding history of voiding symptoms other than the slow stream, denies frequent urinary tract infections or gross hematuria.  PMH: History reviewed. No pertinent past medical history.  PSH: Past Surgical History:  Procedure Laterality Date  . CARDIAC SURGERY    . GALLBLADDER SURGERY    . PROSTATE SURGERY      Allergies: No Known Allergies  Medications: (Not in a hospital admission)    Social History: Social History   Socioeconomic History  . Marital status: Widowed    Spouse name: Not on file  . Number of children: Not on file  . Years of education: Not on file  . Highest education level: Not on file  Occupational History  . Not on file  Tobacco Use  . Smoking status: Former Research scientist (life sciences)  . Smokeless tobacco: Never Used  Substance and Sexual Activity  . Alcohol use: Not on file  . Drug use: Not on file  . Sexual activity: Not on file  Other Topics Concern  . Not on file  Social History Narrative  . Not on file   Social Determinants of Health   Financial Resource Strain:   . Difficulty of Paying Living Expenses:   Food Insecurity:   . Worried About Charity fundraiser in the Last Year:   . Arboriculturist in the Last Year:   Transportation Needs:   . Film/video editor (Medical):   Marland Kitchen Lack of Transportation (Non-Medical):   Physical Activity:   . Days of Exercise per Week:   .  Minutes of Exercise per Session:   Stress:   . Feeling of Stress :   Social Connections:   . Frequency of Communication with Friends and Family:   . Frequency of Social Gatherings with Friends and Family:   . Attends Religious Services:   . Active Member of Clubs or Organizations:   . Attends Archivist Meetings:   Marland Kitchen Marital Status:   Intimate Partner Violence:   . Fear of Current or Ex-Partner:   . Emotionally Abused:   Marland Kitchen Physically Abused:   . Sexually Abused:     Family History: Family History  Problem Relation Age of Onset  . Diabetes Mother   . Diabetes Sister     Review of Systems: Positive: Bladder pain, inability to urinate, gross hematuria Negative:   A further 10 point review of systems was negative except what is listed in the HPI.  Physical Exam: _0 @ General: No acute distress.  Awake. Head:  Normocephalic.  Atraumatic. ENT:  EOMI.  Mucous membranes moist Neck:  Supple.  No lymphadenopathy. CV:  Regular rate. Pulmonary: Equal effort bilaterally.   Abdomen: Soft.  Mildly tender to palpation, suprapubic fullness. Skin:  Normal turgor.  No visible rash. Extremity: No gross deformity of extremities.  Neurologic: Alert. Appropriate mood.  Penis:  Uncircumcised with severe phimosis.  No lesions. Urethra: Orthotopic meatus. Scrotum: No lesions.  No  ecchymosis.  No erythema. Testicles: Descended bilaterally.  No masses bilaterally. Epididymis: Palpable bilaterally.  Non Tender to palpation.  Studies:  Recent Labs    11/09/19 0201  HGB 12.7*  WBC 9.2  PLT 223    Recent Labs    11/09/19 0201  NA 137  K 3.8  CL 100  CO2 24  BUN 28*  CREATININE 1.30*  CALCIUM 9.6  GFRNONAA 50*  GFRAA 58*    The patient's perineum and penis were prepped and draped.  I attempted to place 47 French hematuria catheter.  This was unsuccessfully guided into his urethral meatus which I felt was stenotic.  Following this, guidewire was negotiated into his  urethra through his 5 myotic foreskin.  I then sequentially dilated his distal urethra to 22 [place] with urethral dilators over top of the guidewire.  This was done without difficulty.  I was then able to pass a 49 [place] three-way hematuria catheter without difficulty.  Approximately 1100 cc of bloody urine was obtained.  I irrigated approximately 400 mL of clot out as well.  Following this, the bladder irrigated with clear effluent.  I then plugged the irrigation port and hooked this to dependent drainage.  The patient tolerated the procedure well.  He will be covered with Rocephin for this.   Assessment: Gross hematuria with clot retention, cleared quite easily with large bore catheter placed.  This is draining well.  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  Plan: 1.  We will cover his instrumentation with Rocephin  2.  I will let him go home with close follow-up in our office for trial of voiding  3.  I gave him instructions to come back sooner if he has any significant hematuria recurrence    Pager:[phone]

## 2019-11-09 NOTE — ED Provider Notes (Signed)
[facility] EMERGENCY DEPARTMENT Provider Note   CSN: [id number] Arrival date & time: 11/09/19  0141     History Chief Complaint  Patient presents with  . Hematuria    Louis Houston is a 86 y.o. male.  Patient is an 86 year old male with past medical history of pulmonary hypertension, coronary artery disease, enlarged prostate.  He presents today for evaluation of hematuria.  This started 2 days ago.  Patient reports passing red-colored urine along with clots.  He now feels as though he is not emptying fully when he voids.  He reports abdominal distention.  He denies any fevers or chills.  He denies any burning with urination.  The history is provided by the patient.  Hematuria This is a new problem. The current episode started 2 days ago. The problem occurs constantly. The problem has been gradually worsening. Nothing aggravates the symptoms. Nothing relieves the symptoms. He has tried nothing for the symptoms.       History reviewed. No pertinent past medical history.  Patient Active Problem List   Diagnosis Date Noted  . Dyspnea on exertion 08/26/2019  . Pulmonary hypertension ([place]) 11/05/2015  . Coronary artery disease involving native coronary artery of native heart without angina pectoris 06/14/2015  . Dyslipidemia 06/14/2015  . Essential hypertension 06/14/2015  . Obstructive sleep apnea 06/14/2015  . Status post coronary artery bypass graft 06/14/2015    Past Surgical History:  Procedure Laterality Date  . CARDIAC SURGERY    . GALLBLADDER SURGERY    . PROSTATE SURGERY         Family History  Problem Relation Age of Onset  . Diabetes Mother   . Diabetes Sister     Social History   Tobacco Use  . Smoking status: Former Research scientist (life sciences)  . Smokeless tobacco: Never Used  Substance Use Topics  . Alcohol use: Not on file  . Drug use: Not on file    Home Medications Prior to Admission medications   Medication Sig Start Date End Date Taking?  Authorizing Provider  amLODipine (NORVASC) 10 MG tablet Take 10 mg by mouth daily. 06/09/15   [provider]  aspirin 81 MG EC tablet Take 81 mg by mouth daily. 09/26/17   [provider]  furosemide (LASIX) 40 MG tablet Take 1 tablet (40 mg total) by mouth 2 (two) times daily. 08/30/19   Park Liter, MD  glipiZIDE (GLUCOTROL) 5 MG tablet Take 1 tablet by mouth 2 (two) times daily. 06/09/15   [provider]  hydrALAZINE (APRESOLINE) 25 MG tablet Take 25 mg by mouth 2 (two) times daily.  08/09/17   [provider]  lisinopril (PRINIVIL,ZESTRIL) 40 MG tablet Take 1 tablet by mouth daily. 06/09/15   [provider]  metFORMIN (GLUCOPHAGE) 500 MG tablet Take 1 tablet by mouth daily. 06/09/15   [provider]  metoprolol tartrate (LOPRESSOR) 50 MG tablet Take 1 tablet by mouth 2 (two) times daily. 06/09/15   [provider]  Multiple Vitamin (MULTI-VITAMINS) TABS Take 1 tablet by mouth daily.    [provider]  potassium chloride (KLOR-CON) 10 MEQ tablet Take 1 tablet (10 mEq total) by mouth 2 (two) times daily. 08/30/19   Park Liter, MD  simvastatin (ZOCOR) 40 MG tablet Take 1 tablet by mouth daily. 06/03/15   [provider]  SPIRIVA HANDIHALER 18 MCG inhalation capsule 1 capsule daily. 09/08/19   [provider]    Allergies    Patient has  no known allergies.  Review of Systems   Review of Systems  Genitourinary: Positive for hematuria.  All other systems reviewed and are negative.   Physical Exam Updated Vital Signs BP 133/73   Pulse 85   Temp (!) 97.4 F (36.3 C) (Oral)   Resp 16   SpO2 95%   Physical Exam Vitals and nursing note reviewed.  Constitutional:      General: He is not in acute distress.    Appearance: He is well-developed. He is not diaphoretic.  HENT:     Head: Normocephalic and atraumatic.  Cardiovascular:     Rate and Rhythm: Normal rate and regular rhythm.     Heart  sounds: No murmur heard.  No friction rub.  Pulmonary:     Effort: Pulmonary effort is normal. No respiratory distress.     Breath sounds: Normal breath sounds. No wheezing or rales.  Abdominal:     General: Bowel sounds are normal. There is distension.     Palpations: Abdomen is soft.     Tenderness: There is no abdominal tenderness. There is no right CVA tenderness, left CVA tenderness, guarding or rebound.     Comments: There is mild abdominal distention and suprapubic tenderness.  Musculoskeletal:        General: Normal range of motion.     Cervical back: Normal range of motion and neck supple.  Skin:    General: Skin is warm and dry.  Neurological:     Mental Status: He is alert and oriented to person, place, and time.     Coordination: Coordination normal.     ED Results / Procedures / Treatments   Labs (all labs ordered are listed, but only abnormal results are displayed) Labs Reviewed  URINALYSIS, ROUTINE W REFLEX MICROSCOPIC - Abnormal; Notable for the following components:      Result Value   Color, Urine RED (*)    APPearance TURBID (*)    Glucose, UA   (*)    Value: TEST NOT REPORTED DUE TO COLOR INTERFERENCE OF URINE PIGMENT   Hgb urine dipstick   (*)    Value: TEST NOT REPORTED DUE TO COLOR INTERFERENCE OF URINE PIGMENT   Bilirubin Urine   (*)    Value: TEST NOT REPORTED DUE TO COLOR INTERFERENCE OF URINE PIGMENT   Ketones, ur   (*)    Value: TEST NOT REPORTED DUE TO COLOR INTERFERENCE OF URINE PIGMENT   Protein, ur   (*)    Value: TEST NOT REPORTED DUE TO COLOR INTERFERENCE OF URINE PIGMENT   Nitrite   (*)    Value: TEST NOT REPORTED DUE TO COLOR INTERFERENCE OF URINE PIGMENT   Leukocytes,Ua   (*)    Value: TEST NOT REPORTED DUE TO COLOR INTERFERENCE OF URINE PIGMENT   All other components within normal limits  BASIC METABOLIC PANEL - Abnormal; Notable for the following components:   Glucose, Bld 176 (*)    BUN 28 (*)    Creatinine, Ser 1.30 (*)    GFR  calc non Af Amer 50 (*)    GFR calc Af Amer 58 (*)    All other components within normal limits  CBC - Abnormal; Notable for the following components:   RBC 4.08 (*)    Hemoglobin 12.7 (*)    HCT 37.5 (*)    All other components within normal limits  URINALYSIS, MICROSCOPIC (REFLEX) - Abnormal; Notable for the following components:   Bacteria, UA RARE (*)  All other components within normal limits    EKG None  Radiology No results found.  Procedures Procedures (including critical care time)  Medications Ordered in ED Medications - No data to display  ED Course  I have reviewed the triage vital signs and the nursing notes.  Pertinent labs & imaging results that were available during my care of the patient were reviewed by me and considered in my medical decision making (see chart for details).    MDM Rules/Calculators/A&P  Patient presenting here with complaints of hematuria and urinary retention.  Patient's urinalysis is askew related to blood.  CT scan shows a large clot within the urinary bladder.  This was discussed with Dr. Wendy Poet from urology.  He has recommended a Foley catheter be placed and irrigation be performed.  An attempt at the Foley catheter was made, however the patient has a phimosis and meatal stenosis and the catheter was unable to be advanced.  This was again discussed with Dr. Vikki Ports who came to the ER and placed the Foley catheter and performed the irrigation.  He was able to irrigate a large quantity of clots and the urine is now running clear.  He feels as though the patient is appropriate for outpatient follow-up and does not require admission.  Patient given IV Rocephin at urology request.  He is to follow-up with them in the next 2 days.  Final Clinical Impression(s) / ED Diagnoses Final diagnoses:  None    Rx / DC Orders ED Discharge Orders    None       Veryl Speak, MD 11/09/19 1526

## 2019-11-09 NOTE — ED Notes (Signed)
Patient transported to CT 

## 2019-11-09 NOTE — ED Triage Notes (Signed)
Pt presents with hematuria, denies abdominal pain. Reports difficulty urinating at times.

## 2019-11-11 DIAGNOSIS — R310 Gross hematuria: Secondary | ICD-10-CM | POA: Diagnosis not present

## 2019-11-11 DIAGNOSIS — N471 Phimosis: Secondary | ICD-10-CM | POA: Diagnosis not present

## 2019-11-11 DIAGNOSIS — N35013 Post-traumatic anterior urethral stricture: Secondary | ICD-10-CM | POA: Diagnosis not present

## 2019-11-18 ENCOUNTER — Ambulatory Visit: Payer: Medicare Other | Admitting: Cardiology

## 2019-11-22 DIAGNOSIS — R310 Gross hematuria: Secondary | ICD-10-CM | POA: Diagnosis not present

## 2019-11-22 DIAGNOSIS — N35013 Post-traumatic anterior urethral stricture: Secondary | ICD-10-CM | POA: Diagnosis not present

## 2019-11-23 ENCOUNTER — Other Ambulatory Visit: Payer: Self-pay

## 2019-11-23 ENCOUNTER — Encounter (HOSPITAL_COMMUNITY): Payer: Self-pay | Admitting: Internal Medicine

## 2019-11-23 ENCOUNTER — Ambulatory Visit (HOSPITAL_COMMUNITY)
Admission: RE | Admit: 2019-11-23 | Discharge: 2019-11-23 | Disposition: A | Discharge status: Home / Self Care | Payer: Medicare Other | Source: Ambulatory Visit | Attending: Internal Medicine | Admitting: Internal Medicine

## 2019-11-23 VITALS — BP 140/78 | HR 78 | Wt 183.0 lb

## 2019-11-23 DIAGNOSIS — Z87891 Personal history of nicotine dependence: Secondary | ICD-10-CM | POA: Diagnosis not present

## 2019-11-23 DIAGNOSIS — R338 Other retention of urine: Secondary | ICD-10-CM | POA: Diagnosis not present

## 2019-11-23 DIAGNOSIS — E785 Hyperlipidemia, unspecified: Secondary | ICD-10-CM | POA: Diagnosis not present

## 2019-11-23 DIAGNOSIS — Z9119 Patient's noncompliance with other medical treatment and regimen: Secondary | ICD-10-CM | POA: Diagnosis not present

## 2019-11-23 DIAGNOSIS — I10 Essential (primary) hypertension: Secondary | ICD-10-CM | POA: Diagnosis not present

## 2019-11-23 DIAGNOSIS — R339 Retention of urine, unspecified: Secondary | ICD-10-CM | POA: Diagnosis not present

## 2019-11-23 DIAGNOSIS — R0683 Snoring: Secondary | ICD-10-CM | POA: Diagnosis not present

## 2019-11-23 DIAGNOSIS — Z79899 Other long term (current) drug therapy: Secondary | ICD-10-CM | POA: Diagnosis not present

## 2019-11-23 DIAGNOSIS — Z951 Presence of aortocoronary bypass graft: Secondary | ICD-10-CM | POA: Insufficient documentation

## 2019-11-23 DIAGNOSIS — I071 Rheumatic tricuspid insufficiency: Secondary | ICD-10-CM | POA: Diagnosis not present

## 2019-11-23 DIAGNOSIS — E119 Type 2 diabetes mellitus without complications: Secondary | ICD-10-CM | POA: Diagnosis not present

## 2019-11-23 DIAGNOSIS — N471 Phimosis: Secondary | ICD-10-CM | POA: Diagnosis not present

## 2019-11-23 DIAGNOSIS — Z7984 Long term (current) use of oral hypoglycemic drugs: Secondary | ICD-10-CM | POA: Diagnosis not present

## 2019-11-23 DIAGNOSIS — G4733 Obstructive sleep apnea (adult) (pediatric): Secondary | ICD-10-CM | POA: Insufficient documentation

## 2019-11-23 DIAGNOSIS — R0602 Shortness of breath: Secondary | ICD-10-CM | POA: Diagnosis not present

## 2019-11-23 DIAGNOSIS — I2510 Atherosclerotic heart disease of native coronary artery without angina pectoris: Secondary | ICD-10-CM | POA: Diagnosis not present

## 2019-11-23 DIAGNOSIS — I272 Pulmonary hypertension, unspecified: Secondary | ICD-10-CM | POA: Insufficient documentation

## 2019-11-23 DIAGNOSIS — Z7982 Long term (current) use of aspirin: Secondary | ICD-10-CM | POA: Insufficient documentation

## 2019-11-23 DIAGNOSIS — N35013 Post-traumatic anterior urethral stricture: Secondary | ICD-10-CM | POA: Diagnosis not present

## 2019-11-23 LAB — CBC
HCT: 34.6 % — ABNORMAL LOW (ref 39.0–52.0)
Hemoglobin: 11.7 g/dL — ABNORMAL LOW (ref 13.0–17.0)
MCH: 32.5 pg (ref 26.0–34.0)
MCHC: 33.8 g/dL (ref 30.0–36.0)
MCV: 96.1 fL (ref 80.0–100.0)
Platelets: 309 10*3/uL (ref 150–400)
RBC: 3.6 MIL/uL — ABNORMAL LOW (ref 4.22–5.81)
RDW: 14.7 % (ref 11.5–15.5)
WBC: 11.9 10*3/uL — ABNORMAL HIGH (ref 4.0–10.5)
nRBC: 0 % (ref 0.0–0.2)

## 2019-11-23 LAB — BASIC METABOLIC PANEL
Anion gap: 13 (ref 5–15)
BUN: 23 mg/dL (ref 8–23)
CO2: 27 mmol/L (ref 22–32)
Calcium: 9.6 mg/dL (ref 8.9–10.3)
Chloride: 96 mmol/L — ABNORMAL LOW (ref 98–111)
Creatinine, Ser: 1.31 mg/dL — ABNORMAL HIGH (ref 0.61–1.24)
GFR calc Af Amer: 57 mL/min — ABNORMAL LOW (ref >60)
GFR calc non Af Amer: 49 mL/min — ABNORMAL LOW (ref >60)
Glucose, Bld: 185 mg/dL — ABNORMAL HIGH (ref 70–99)
Potassium: 4.6 mmol/L (ref 3.5–5.1)
Sodium: 136 mmol/L (ref 135–145)

## 2019-11-23 LAB — PROTIME-INR
INR: 1.1 (ref 0.8–1.2)
Prothrombin Time: 13.7 seconds (ref 11.4–15.2)

## 2019-11-23 NOTE — H&P (View-Only) (Signed)
 ADVANCED HF CLINIC CONSULT NOTE  Referring Physician: Krasowski   Primary Cardiologist: Krasowski  HPI:  Louis Houston is a 86 y.o. male with CAD s/p CABG, DM2, HTN. HL and OSA. Referred by Dr. Krasowski for further evaluation of pulmonary HTN seen on echo   He has been following with Dr. K for years and has had stable mild to moderate PAH on echo without many symptoms. Recently more DOE. Repeat echo 4/21 as below. Attempts at diuresis made but not very successful.   CT scan by PCP. No PE.  His primary care physician also did pulmonary function testing which showed moderate severe obstructive airways disease and he was given some bronchodilator. ( I do not have the details on this)  Here with his daughter [name] (former detective). Worked in construction. Retired at age 72. Was former smoker for ~ 20 yrs 1-2ppd. Quit in 1966 due to recurrent colds. Has been relatively active. Has had increasing SOB for 6 months to 1 year. Gets SOB with very minimal activity. Unable to go to mailbox. + LE edema. No syncope or presyncope. Wears 2L O2 at night. Does not use CPAP. Doesn't wear O2 much due to tanks being so heavy. Did hall walk with Dr. K and sats 91% -> 84%   Recently seen in ER for hematuria. Found to have urinary retention. Foley placed.     ECHO 08/26/19 1. There is prominent septal bounce, consider constriction. Left  ventricular ejection fraction, by estimation, is 55 to 60%. The left  ventricle has normal function. The left ventricle has no regional wall  motion abnormalities. Left ventricular diastolic  parameters are consistent with Grade I diastolic dysfunction (impaired  relaxation). Elevated left ventricular end-diastolic pressure.  2. Right ventricular systolic function is normal. The right ventricular  size is severely enlarged. There is severely elevated pulmonary artery  systolic pressure. The estimated right ventricular systolic pressure is  81.9 mmHg.  3. Left  atrial size was mildly dilated.  4. Right atrial size was moderately dilated.  5. The mitral valve is normal in structure. Trivial mitral valve  regurgitation. No evidence of mitral stenosis.  6. Tricuspid valve regurgitation is moderate.  7. The aortic valve is tricuspid. Aortic valve regurgitation is not  visualized. No aortic stenosis is present.  8. The inferior vena cava is dilated in size with <50% respiratory  variability, suggesting right atrial pressure of 15 mmHg.    Review of Systems: [y] = yes, [ ] = no   General: Weight gain [ ]; Weight loss [ ]; Anorexia [ ]; Fatigue [ y]; Fever [ ]; Chills [ ]; Weakness [y ]  Cardiac: Chest pain/pressure [ ]; Resting SOB [ ]; Exertional SOB [y ]; Orthopnea [ ]; Pedal Edema [ y]; Palpitations [ ]; Syncope [ ]; Presyncope [ ]; Paroxysmal nocturnal dyspnea[ ]  Pulmonary: Cough [ y]; Wheezing[ ]; Hemoptysis[ ]; Sputum [ ]; Snoring [ ]  GI: Vomiting[ ]; Dysphagia[ ]; Melena[ ]; Hematochezia [ ]; Heartburn[ ]; Abdominal pain [ ]; Constipation [ ]; Diarrhea [ ]; BRBPR [ ]  GU: Hematuria[ ]; Dysuria [ ]; Nocturia[ ]  Vascular: Pain in legs with walking [ ]; Pain in feet with lying flat [ ]; Non-healing sores [ ]; Stroke [ ]; TIA [ ]; Slurred speech [ ];  Neuro: Headaches[ ]; Vertigo[ ]; Seizures[ ]; Paresthesias[ ];Blurred vision [ ]; Diplopia [ ]; Vision changes [ ]  Ortho/Skin: Arthritis [ y]; Joint pain [ y]; Muscle   pain [ ]; Joint swelling [ ]; Back Pain [ ]; Rash [ ]  Psych: Depression[ ]; Anxiety[ ]  Heme: Bleeding problems [ ]; Clotting disorders [ ]; Anemia [ ]  Endocrine: Diabetes [ ]; Thyroid dysfunction[ ]    Current Outpatient Medications  Medication Sig Dispense Refill  . amLODipine (NORVASC) 5 MG tablet Take 5 mg by mouth daily.    . aspirin 81 MG EC tablet Take 81 mg by mouth daily.  12  . furosemide (LASIX) 40 MG tablet Take 1 tablet (40 mg total) by mouth 2 (two) times daily. 60 tablet 1  . glipiZIDE (GLUCOTROL) 5 MG  tablet Take 1 tablet by mouth 2 (two) times daily.    . hydrALAZINE (APRESOLINE) 25 MG tablet Take 25 mg by mouth 2 (two) times daily.   12  . lisinopril (PRINIVIL,ZESTRIL) 40 MG tablet Take 1 tablet by mouth daily.    . metFORMIN (GLUCOPHAGE) 500 MG tablet Take 1 tablet by mouth daily.    . metoprolol tartrate (LOPRESSOR) 50 MG tablet Take 1 tablet by mouth 2 (two) times daily.    . Multiple Vitamin (MULTI-VITAMINS) TABS Take 1 tablet by mouth daily.    . potassium chloride (KLOR-CON) 10 MEQ tablet Take 1 tablet (10 mEq total) by mouth 2 (two) times daily. 60 tablet 1  . simvastatin (ZOCOR) 40 MG tablet Take 1 tablet by mouth daily.    . SPIRIVA HANDIHALER 18 MCG inhalation capsule 1 capsule daily.     No current facility-administered medications for this encounter.    No Known Allergies    Social History   Socioeconomic History  . Marital status: Widowed    Spouse name: Not on file  . Number of children: Not on file  . Years of education: Not on file  . Highest education level: Not on file  Occupational History  . Not on file  Tobacco Use  . Smoking status: Former Smoker  . Smokeless tobacco: Never Used  Substance and Sexual Activity  . Alcohol use: Not on file  . Drug use: Not on file  . Sexual activity: Not on file  Other Topics Concern  . Not on file  Social History Narrative  . Not on file   Social Determinants of Health   Financial Resource Strain:   . Difficulty of Paying Living Expenses:   Food Insecurity:   . Worried About Running Out of Food in the Last Year:   . Ran Out of Food in the Last Year:   Transportation Needs:   . Lack of Transportation (Medical):   . Lack of Transportation (Non-Medical):   Physical Activity:   . Days of Exercise per Week:   . Minutes of Exercise per Session:   Stress:   . Feeling of Stress :   Social Connections:   . Frequency of Communication with Friends and Family:   . Frequency of Social Gatherings with Friends and  Family:   . Attends Religious Services:   . Active Member of Clubs or Organizations:   . Attends Club or Organization Meetings:   . Marital Status:   Intimate Partner Violence:   . Fear of Current or Ex-Partner:   . Emotionally Abused:   . Physically Abused:   . Sexually Abused:       Family History  Problem Relation Age of Onset  . Diabetes Mother   . Diabetes Sister     Vitals:   11/23/19 1052  BP: 140/78  Pulse: 78    SpO2: 94%  Weight: 83 kg (183 lb)    PHYSICAL EXAM: General:  Elderly HOH. Walks with cane No respiratory difficulty HEENT: normal Neck: supple. JVP 10-12 prominent v- waves  Carotids 2+ bilat; no bruits. No lymphadenopathy or thryomegaly appreciated. Cor: PMI nondisplaced. Regular rate & rhythm. 2/6 TR Lungs: clear decreased BS throughout Abdomen: soft, nontender, nondistended. No hepatosplenomegaly. No bruits or masses. Good bowel sounds. Extremities: no cyanosis, clubbing, rash, trace edema Neuro: alert & oriented x 3, cranial nerves grossly intact. moves all 4 extremities w/o difficulty. Affect pleasant.   ASSESSMENT & PLAN:  1. Pulmonary HTN by echo 4/21 - suspect multifactorial - concern for constriction has been raised by PA pressures higher than we typically see in constriction - CT no evidence of PE (no VQ done) - PFTs reportedly with moderate severe obstructive airways disease. 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 Will need portable/tank/compressor to help him use more regularly. Wil refer to Pulmonary for eval.  - Will need R/L cath to further elucidate   2. CAD s/p CABG - stable no evidence of ischemia - continue ASA. - Consider Jardiance with CAD and DM2  3. OSA - noncompliant with CPAP   Glori Bickers, MD  11:10 AM

## 2019-11-23 NOTE — Progress Notes (Signed)
[facility]ED [facility] CLINIC CONSULT NOTE  Referring Physician: Agustin Cree   Primary Cardiologist: Agustin Cree  HPI:  Louis Houston is a 86 y.o. male with CAD s/p CABG, DM2, HTN. HL and OSA. Referred by Dr. Agustin Cree for further evaluation of pulmonary HTN seen on echo   He has been following with Dr. Raliegh Ip for years and has had stable mild to moderate PAH on echo without many symptoms. Recently more DOE. Repeat echo 4/21 as below. Attempts at diuresis made but not very successful.   CT scan by PCP. No PE.  His primary care physician also did pulmonary function testing which showed moderate severe obstructive airways disease and he was given some bronchodilator. ( I do not have the details on this)  Here with his daughter Louis Houston (former Tax adviser). Worked in Architect. Retired at age 27. Was former smoker for ~ 20 yrs 1-2ppd. Quit in 1966 due to recurrent colds. Has been relatively active. Has had increasing SOB for 6 months to 1 year. Gets SOB with very minimal activity. Unable to go to mailbox. + LE edema. No syncope or presyncope. Wears 2L O2 at night. Does not use CPAP. Doesn't wear O2 much due to tanks being so heavy. Did hall walk with Dr. Raliegh Ip and sats 91% -> 84%   Recently seen in ER for hematuria. Found to have urinary retention. Foley placed.     ECHO 08/26/19 1. There is prominent septal bounce, consider constriction. Left  ventricular ejection fraction, by estimation, is 55 to 60%. The left  ventricle has normal function. The left ventricle has no regional wall  motion abnormalities. Left ventricular diastolic  parameters are consistent with Grade I diastolic dysfunction (impaired  relaxation). Elevated left ventricular end-diastolic pressure.  2. Right ventricular systolic function is normal. The right ventricular  size is severely enlarged. There is severely elevated pulmonary artery  systolic pressure. The estimated right ventricular systolic pressure is  00.7 mmHg.  3. Left  atrial size was mildly dilated.  4. Right atrial size was moderately dilated.  5. The mitral valve is normal in structure. Trivial mitral valve  regurgitation. No evidence of mitral stenosis.  6. Tricuspid valve regurgitation is moderate.  7. The aortic valve is tricuspid. Aortic valve regurgitation is not  visualized. No aortic stenosis is present.  8. The inferior vena cava is dilated in size with <50% respiratory  variability, suggesting right atrial pressure of 15 mmHg.    Review of Systems: [y] = yes, _0  = no   General: Weight gain _1 ; Weight loss _2 ; Anorexia _3 ; Fatigue [ y]; Fever _4 ; Chills _5 ; Weakness Blue.Reese ]  Cardiac: Chest pain/pressure _6 ; Resting SOB _7 ; Exertional SOB Blue.Reese ]; Orthopnea _8 ; Pedal Edema [ y]; Palpitations _9 ; Syncope _10 ; Presyncope _11 ; Paroxysmal nocturnal dyspnea_12   Pulmonary: Cough [ y]; Wheezing_13 ; Hemoptysis_14 ; Sputum _15 ; Snoring _16   GI: Vomiting_17 ; Dysphagia_18 ; Melena_19 ; Hematochezia _20 ; Heartburn_21 ; Abdominal pain _22 ; Constipation _23 ; Diarrhea _24 ; BRBPR _25   GU: Hematuria_26 ; Dysuria _27 ; Nocturia_28   Vascular: Pain in legs with walking _29 ; Pain in feet with lying flat _30 ; Non-healing sores _31 ; Stroke _32 ; TIA _33 ; Slurred speech _34 ;  Neuro: Headaches_35 ; Vertigo_36 ; Seizures_37 ; Paresthesias_38 ;Blurred vision _39 ; Diplopia _40 ; Vision changes _41   Ortho/Skin: Arthritis [ y]; Joint pain [ y]; Muscle  pain _0 ; Joint swelling _1 ; Back Pain _2 ; Rash _3   Psych: Depression_4 ; Anxiety_5   Heme: Bleeding problems _6 ; Clotting disorders _7 ; Anemia _8   Endocrine: Diabetes _9 ; Thyroid dysfunction_10     Current Outpatient Medications  Medication Sig Dispense Refill  . amLODipine (NORVASC) 5 MG tablet Take 5 mg by mouth daily.    Marland Kitchen aspirin 81 MG EC tablet Take 81 mg by mouth daily.  12  . furosemide (LASIX) 40 MG tablet Take 1 tablet (40 mg total) by mouth 2 (two) times daily. 60 tablet 1  . glipiZIDE (GLUCOTROL) 5 MG  tablet Take 1 tablet by mouth 2 (two) times daily.    . hydrALAZINE (APRESOLINE) 25 MG tablet Take 25 mg by mouth 2 (two) times daily.   12  . lisinopril (PRINIVIL,ZESTRIL) 40 MG tablet Take 1 tablet by mouth daily.    . metFORMIN (GLUCOPHAGE) 500 MG tablet Take 1 tablet by mouth daily.    . metoprolol tartrate (LOPRESSOR) 50 MG tablet Take 1 tablet by mouth 2 (two) times daily.    . Multiple Vitamin (MULTI-VITAMINS) TABS Take 1 tablet by mouth daily.    . potassium chloride (KLOR-CON) 10 MEQ tablet Take 1 tablet (10 mEq total) by mouth 2 (two) times daily. 60 tablet 1  . simvastatin (ZOCOR) 40 MG tablet Take 1 tablet by mouth daily.    Marland Kitchen SPIRIVA HANDIHALER 18 MCG inhalation capsule 1 capsule daily.     No current facility-administered medications for this encounter.    No Known Allergies    Social History   Socioeconomic History  . Marital status: Widowed    Spouse name: Not on file  . Number of children: Not on file  . Years of education: Not on file  . Highest education level: Not on file  Occupational History  . Not on file  Tobacco Use  . Smoking status: Former Research scientist (life sciences)  . Smokeless tobacco: Never Used  Substance and Sexual Activity  . Alcohol use: Not on file  . Drug use: Not on file  . Sexual activity: Not on file  Other Topics Concern  . Not on file  Social History Narrative  . Not on file   Social Determinants of Health   Financial Resource Strain:   . Difficulty of Paying Living Expenses:   Food Insecurity:   . Worried About Charity fundraiser in the Last Year:   . Arboriculturist in the Last Year:   Transportation Needs:   . Film/video editor (Medical):   Marland Kitchen Lack of Transportation (Non-Medical):   Physical Activity:   . Days of Exercise per Week:   . Minutes of Exercise per Session:   Stress:   . Feeling of Stress :   Social Connections:   . Frequency of Communication with Friends and Family:   . Frequency of Social Gatherings with Friends and  Family:   . Attends Religious Services:   . Active Member of Clubs or Organizations:   . Attends Archivist Meetings:   Marland Kitchen Marital Status:   Intimate Partner Violence:   . Fear of Current or Ex-Partner:   . Emotionally Abused:   Marland Kitchen Physically Abused:   . Sexually Abused:       Family History  Problem Relation Age of Onset  . Diabetes Mother   . Diabetes Sister     Vitals:   11/23/19 1052  BP: 140/78  Pulse: 78  SpO2: 94%  Weight: 83 kg (183 lb)    PHYSICAL EXAM: General:  Elderly HOH. Walks with cane No respiratory difficulty HEENT: normal Neck: supple. JVP 10-12 prominent v- waves  Carotids 2+ bilat; no bruits. No lymphadenopathy or thryomegaly appreciated. Cor: PMI nondisplaced. Regular rate & rhythm. 2/6 TR Lungs: clear decreased BS throughout Abdomen: soft, nontender, nondistended. No hepatosplenomegaly. No bruits or masses. Good bowel sounds. Extremities: no cyanosis, clubbing, rash, trace edema Neuro: alert & oriented x 3, cranial nerves grossly intact. moves all 4 extremities w/o difficulty. Affect pleasant.   ASSESSMENT & PLAN:  1. Pulmonary HTN by echo 4/21 - suspect multifactorial - concern for constriction has been raised by PA pressures higher than we typically see in constriction - CT no evidence of PE (no VQ done) - PFTs reportedly with moderate severe obstructive airways disease. 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 Will need portable/tank/compressor to help him use more regularly. Wil refer to Pulmonary for eval.  - Will need R/L cath to further elucidate   2. CAD s/p CABG - stable no evidence of ischemia - continue ASA. - Consider Jardiance with CAD and DM2  3. OSA - noncompliant with CPAP   Glori Bickers, MD  11:10 AM

## 2019-11-23 NOTE — Patient Instructions (Signed)
It was great to see you today! No medication changes are needed at this time.  You have been referred to [facility] Address: [address], [place], [place] [address] Phone: [phone] -they will be in contact with you for an appointment  Your provider has recommended that you have a home sleep study.  [employer] is the company that does these test.  They will contact you by phone and must speak with you before they can ship the equipment.  Once they have spoken with you they will send the equipment right to your home with instructions on how to set it up.  Once you have completed the test simply box all the equipment back up and mail back to the company.  IF you have any questions or issues with the equipment please call the company directly at [phone].  If your test is positive for sleep apnea and you need a home CPAP machine you will be contacted by Dr Theodosia Blender office [facility]) to set this up.     You are scheduled for a Cardiac Catheterization on Friday, July 23 with Dr. Glori Bickers.  1. Please arrive at the [facility] (Main Entrance A) at [facility]: [address] [place], [place] [address] at 11:00 AM (This time is two hours before your procedure to ensure your preparation). Free valet parking service is available.   Special note: Every effort is made to have your procedure done on time. Please understand that emergencies sometimes delay scheduled procedures.  2. Diet: Do not eat solid foods after midnight.  The patient may have clear liquids until 5am upon the day of the procedure.  3. Labs: Pre procedure labs done 11/23/19  4. Medication instructions in preparation for your procedure:   Contrast Allergy: No   Stop taking, Lasix (Furosemide)  Friday, July 23,   Do not take Diabetes Med Glucophage (Metformin) on the day of the procedure and HOLD 48 HOURS AFTER THE PROCEDURE.  On the morning of your procedure, take your Aspirin and any  morning medicines NOT listed above.  You may use sips of water.  5. Plan for one night stay--bring personal belongings. 6. Bring a current list of your medications and current insurance cards. 7. You MUST have a responsible person to drive you home. 8. Someone MUST be with you the first 24 hours after you arrive home or your discharge will be delayed. 9. Please wear clothes that are easy to get on and off and wear slip-on shoes.  Thank you for allowing [place] to care for you!   -- [facility]

## 2019-11-24 ENCOUNTER — Other Ambulatory Visit (HOSPITAL_COMMUNITY): Payer: Self-pay | Admitting: *Deleted

## 2019-11-24 DIAGNOSIS — I272 Pulmonary hypertension, unspecified: Secondary | ICD-10-CM

## 2019-11-24 MED ORDER — SODIUM CHLORIDE 0.9% FLUSH: 3.0000 mL | Freq: Two times a day (BID) | INTRAVENOUS | Status: DC

## 2019-11-25 ENCOUNTER — Ambulatory Visit (HOSPITAL_COMMUNITY)
Admission: RE | Disposition: A | Discharge status: Home / Self Care | Payer: Self-pay | Source: Home / Self Care | Attending: Internal Medicine

## 2019-11-25 ENCOUNTER — Other Ambulatory Visit: Payer: Self-pay

## 2019-11-25 ENCOUNTER — Ambulatory Visit (HOSPITAL_COMMUNITY)
Admission: RE | Admit: 2019-11-25 | Discharge: 2019-11-25 | Disposition: A | Discharge status: Home / Self Care | Payer: Medicare Other | Attending: Internal Medicine | Admitting: Internal Medicine

## 2019-11-25 DIAGNOSIS — E785 Hyperlipidemia, unspecified: Secondary | ICD-10-CM | POA: Insufficient documentation

## 2019-11-25 DIAGNOSIS — Z87891 Personal history of nicotine dependence: Secondary | ICD-10-CM | POA: Diagnosis not present

## 2019-11-25 DIAGNOSIS — Z951 Presence of aortocoronary bypass graft: Secondary | ICD-10-CM | POA: Insufficient documentation

## 2019-11-25 DIAGNOSIS — Z79899 Other long term (current) drug therapy: Secondary | ICD-10-CM | POA: Diagnosis not present

## 2019-11-25 DIAGNOSIS — I2510 Atherosclerotic heart disease of native coronary artery without angina pectoris: Secondary | ICD-10-CM | POA: Diagnosis not present

## 2019-11-25 DIAGNOSIS — Z7984 Long term (current) use of oral hypoglycemic drugs: Secondary | ICD-10-CM | POA: Insufficient documentation

## 2019-11-25 DIAGNOSIS — G4733 Obstructive sleep apnea (adult) (pediatric): Secondary | ICD-10-CM | POA: Insufficient documentation

## 2019-11-25 DIAGNOSIS — I2721 Secondary pulmonary arterial hypertension: Secondary | ICD-10-CM | POA: Diagnosis not present

## 2019-11-25 DIAGNOSIS — E119 Type 2 diabetes mellitus without complications: Secondary | ICD-10-CM | POA: Insufficient documentation

## 2019-11-25 DIAGNOSIS — Z9119 Patient's noncompliance with other medical treatment and regimen: Secondary | ICD-10-CM | POA: Insufficient documentation

## 2019-11-25 DIAGNOSIS — I10 Essential (primary) hypertension: Secondary | ICD-10-CM | POA: Insufficient documentation

## 2019-11-25 DIAGNOSIS — Z7982 Long term (current) use of aspirin: Secondary | ICD-10-CM | POA: Insufficient documentation

## 2019-11-25 DIAGNOSIS — I272 Pulmonary hypertension, unspecified: Secondary | ICD-10-CM

## 2019-11-25 HISTORY — PX: RIGHT HEART CATH: CATH118263

## 2019-11-25 LAB — GLUCOSE, CAPILLARY: Glucose-Capillary: 110 mg/dL — ABNORMAL HIGH (ref 70–99)

## 2019-11-25 LAB — POCT I-STAT EG7
Acid-Base Excess: 7 mmol/L — ABNORMAL HIGH (ref 0.0–2.0)
Acid-Base Excess: 8 mmol/L — ABNORMAL HIGH (ref 0.0–2.0)
Bicarbonate: 31.4 mmol/L — ABNORMAL HIGH (ref 20.0–28.0)
Bicarbonate: 32.1 mmol/L — ABNORMAL HIGH (ref 20.0–28.0)
Calcium, Ion: 1.17 mmol/L (ref 1.15–1.40)
Calcium, Ion: 1.18 mmol/L (ref 1.15–1.40)
HCT: 28 % — ABNORMAL LOW (ref 39.0–52.0)
HCT: 29 % — ABNORMAL LOW (ref 39.0–52.0)
Hemoglobin: 9.5 g/dL — ABNORMAL LOW (ref 13.0–17.0)
Hemoglobin: 9.9 g/dL — ABNORMAL LOW (ref 13.0–17.0)
O2 Saturation: 59 %
O2 Saturation: 61 %
Potassium: 4 mmol/L (ref 3.5–5.1)
Potassium: 4.1 mmol/L (ref 3.5–5.1)
Sodium: 138 mmol/L (ref 135–145)
Sodium: 138 mmol/L (ref 135–145)
TCO2: 33 mmol/L — ABNORMAL HIGH (ref 22–32)
TCO2: 33 mmol/L — ABNORMAL HIGH (ref 22–32)
pCO2, Ven: 42.7 mmHg — ABNORMAL LOW (ref 44.0–60.0)
pCO2, Ven: 43.6 mmHg — ABNORMAL LOW (ref 44.0–60.0)
pH, Ven: 7.465 — ABNORMAL HIGH (ref 7.250–7.430)
pH, Ven: 7.484 — ABNORMAL HIGH (ref 7.250–7.430)
pO2, Ven: 29 mmHg — CL (ref 32.0–45.0)
pO2, Ven: 30 mmHg — CL (ref 32.0–45.0)

## 2019-11-25 SURGERY — RIGHT HEART CATH
Anesthesia: LOCAL

## 2019-11-25 MED ORDER — FENTANYL CITRATE (PF) 100 MCG/2ML IJ SOLN
INTRAMUSCULAR | Status: AC
  Filled 2019-11-25: qty 2

## 2019-11-25 MED ORDER — SODIUM CHLORIDE 0.9% FLUSH: 3.0000 mL | INTRAVENOUS | Status: DC | PRN

## 2019-11-25 MED ORDER — SODIUM CHLORIDE 0.9 % IV SOLN: 250.0000 mL | INTRAVENOUS | Status: DC | PRN

## 2019-11-25 MED ORDER — ONDANSETRON HCL 4 MG/2ML IJ SOLN: 4.0000 mg | Freq: Four times a day (QID) | INTRAMUSCULAR | Status: DC | PRN

## 2019-11-25 MED ORDER — LIDOCAINE HCL (PF) 1 % IJ SOLN
INTRAMUSCULAR | Status: DC | PRN
  Administered 2019-11-25: 1 mL

## 2019-11-25 MED ORDER — MIDAZOLAM HCL 2 MG/2ML IJ SOLN
INTRAMUSCULAR | Status: AC
  Filled 2019-11-25: qty 2

## 2019-11-25 MED ORDER — SODIUM CHLORIDE 0.9% FLUSH: 3.0000 mL | Freq: Two times a day (BID) | INTRAVENOUS | Status: DC

## 2019-11-25 MED ORDER — HEPARIN (PORCINE) IN NACL 1000-0.9 UT/500ML-% IV SOLN
INTRAVENOUS | Status: AC
  Filled 2019-11-25: qty 1000

## 2019-11-25 MED ORDER — ACETAMINOPHEN 325 MG PO TABS: 650.0000 mg | ORAL_TABLET | ORAL | Status: DC | PRN

## 2019-11-25 MED ORDER — SODIUM CHLORIDE 0.9 % IV SOLN: INTRAVENOUS | Status: DC

## 2019-11-25 MED ORDER — LABETALOL HCL 5 MG/ML IV SOLN: 10.0000 mg | INTRAVENOUS | Status: DC | PRN

## 2019-11-25 MED ORDER — ASPIRIN 81 MG PO CHEW: 81.0000 mg | CHEWABLE_TABLET | ORAL | Status: DC

## 2019-11-25 MED ORDER — HEPARIN (PORCINE) IN NACL 1000-0.9 UT/500ML-% IV SOLN
INTRAVENOUS | Status: DC | PRN
  Administered 2019-11-25: 500 mL

## 2019-11-25 MED ORDER — LIDOCAINE HCL (PF) 1 % IJ SOLN
INTRAMUSCULAR | Status: AC
  Filled 2019-11-25: qty 30

## 2019-11-25 MED ORDER — HYDRALAZINE HCL 20 MG/ML IJ SOLN: 10.0000 mg | INTRAMUSCULAR | Status: DC | PRN

## 2019-11-25 MED ORDER — VERAPAMIL HCL 2.5 MG/ML IV SOLN
INTRAVENOUS | Status: AC
  Filled 2019-11-25: qty 2

## 2019-11-25 MED ORDER — HEPARIN SODIUM (PORCINE) 1000 UNIT/ML IJ SOLN
INTRAMUSCULAR | Status: AC
  Filled 2019-11-25: qty 1

## 2019-11-25 SURGICAL SUPPLY — 8 items
CATH SWAN GANZ 7F STRAIGHT (CATHETERS) ×2 IMPLANT
GLIDESHEATH SLEND SS 6F .021 (SHEATH) ×2 IMPLANT
GLIDESHEATH SLENDER 7FR .021G (SHEATH) ×2 IMPLANT
GUIDEWIRE .025 260CM (WIRE) ×2 IMPLANT
KIT HEART LEFT (KITS) ×2 IMPLANT
PACK CARDIAC CATHETERIZATION (CUSTOM PROCEDURE TRAY) ×2 IMPLANT
TRANSDUCER W/STOPCOCK (MISCELLANEOUS) ×2 IMPLANT
WIRE EMERALD 3MM-J .025X260CM (WIRE) ×2 IMPLANT

## 2019-11-25 NOTE — Research (Signed)
[place] Informed Consent   Subject Name: Louis Houston  Subject met inclusion and exclusion criteria.  The informed consent form, study requirements and expectations were reviewed with the subject and questions and concerns were addressed prior to the signing of the consent form.  The subject verbalized understanding of the trail requirements.  The subject agreed to participate in the [facility] trial and signed the informed consent.  The informed consent was obtained prior to performance of any protocol-specific procedures for the subject.  A copy of the signed informed consent was given to the subject and a copy was placed in the subject's medical record.  Philemon Kingdom D 11/25/2019, 1151 am

## 2019-11-25 NOTE — Discharge Instructions (Signed)
Venogram A venogram, or venography, is a procedure that uses an X-ray and dye (contrast) to examine how well the veins work and how blood flows through them. Contrast helps the veins show up on X-rays. A venogram may be done:  To evaluate abnormalities in the vein.  To identify clots within veins, such as deep vein thrombosis (DVT).  To map out the veins that might be needed for another procedure. Tell a health care provider about:  Any allergies you have, especially to medicines, shellfish, iodine, and contrast.  All medicines you are taking, including vitamins, herbs, eye drops, creams, and over-the-counter medicines.  Any problems you or family members have had with anesthetic medicines.  Any blood disorders you have.  Any surgeries you have had and any complications that occurred.  Any medical conditions you have.  Whether you are pregnant, may be pregnant, or are breastfeeding.  Any history of smoking or tobacco use. What are the risks? Generally, this is a safe procedure. However, problems may occur, including:  Infection.  Bleeding.  Blood clots.  Allergic reaction to medicines or contrast.  Damage to other structures or organs.  Kidney problems.  Increased risk of cancer. Being exposed to too much radiation over a lifetime can increase the risk of cancer. The risk is small. What happens before the procedure? Medicines Ask your health care provider about:  Changing or stopping your regular medicines. This is especially important if you are taking diabetes medicines or blood thinners.  Taking medicines such as aspirin and ibuprofen. These medicines can thin your blood. Do not take these medicines unless your health care provider tells you to take them.  Taking over-the-counter medicines, vitamins, herbs, and supplements. General instructions  Follow instructions from your health care provider about eating or drinking restrictions.  You may have blood tests  to check how well your kidneys and liver are working and how well your blood can clot.  Plan to have someone take you home from the hospital or clinic. What happens during the procedure?   An IV will be inserted into one of your veins.  You may be given a medicine to help you relax (sedative).  You will lie down on an X-ray table. The table may be tilted in different directions during the procedure to help the contrast move throughout your body. Safety straps will keep you secure if the table is tilted.  If veins in your arm or leg will be examined, a band may be wrapped around that arm or leg to keep the veins full of blood. This may cause your arm or leg to feel numb.  The contrast will be injected into your IV. You may have a hot, flushed feeling as it moves throughout your body. You may also have a metallic taste in your mouth. Both of these sensations will go away after the test is complete.  You may be asked to lie in different positions or place your legs or arms in different positions.  At the end of the procedure, you may be given IV fluids to help wash or flush the contrast out of your veins.  The IV will be removed, and pressure will be applied to the IV site to prevent bleeding. A bandage (dressing) may be applied to the IV site. The exact procedure may vary among health care providers and hospitals. What can I expect after the procedure?  Your blood pressure, heart rate, breathing rate, and blood oxygen level will be monitored until you   leave the hospital or clinic.  You may be given something to eat and drink.  You may have bruising or mild discomfort in the area where the IV was inserted. Follow these instructions at home: Eating and drinking   Follow instructions from your health care provider about eating or drinking restrictions.  Drink a lot of water for the first several days after the procedure, as directed by your health care provider. This helps to flush the  contrast out of your body. Activity  Rest as told by your health care provider.  Return to your normal activities as told by your health care provider. Ask your health care provider what activities are safe for you.  If you were given a sedative during your procedure, do not drive for 24 hours or until your health care provider approves. General instructions  Check your IV insertion area every day for signs of infection. Check for: ? Redness, swelling, or pain. ? Fluid or blood. ? Warmth. ? Pus or a bad smell.  Take over-the-counter and prescription medicines only as told by your health care provider.  Keep all follow-up visits as told by your health care provider. This is important. Contact a health care provider if:  Your skin becomes itchy or you develop a rash or hives.  You have a fever that does not get better with medicine.  You feel nauseous or you vomit.  You have redness, swelling, or pain around the insertion site.  You have fluid or blood coming from the insertion site.  Your insertion area feels warm to the touch.  You have pus or a bad smell coming from the insertion site. Get help right away if you:  Have shortness of breath or difficulty breathing.  Develop chest pain.  Faint.  Feel very dizzy. These symptoms may represent a serious problem that is an emergency. Do not wait to see if the symptoms will go away. Get medical help right away. Call your local emergency services (911 in the [place] Do not drive yourself to the hospital. Summary  A venogram, or venography, is a procedure that uses an X-ray and contrast dye to check how well the veins work and how blood flows through them.  An IV will be inserted into one of your veins in order to inject the contrast.  During the exam, you will lie on an X-ray table. The table may be tilted in different directions during the procedure to help the contrast move throughout your body. Safety straps will keep you  secure.  After the procedure, you will need to drink a lot of water to help wash or flush the contrast out of your body. This information is not intended to replace advice given to you by your health care provider. Make sure you discuss any questions you have with your health care provider. Document Revised: 11/27/2018 Document Reviewed: 11/27/2018 Elsevier Patient Education  [place].

## 2019-11-27 NOTE — Interval H&P Note (Signed)
History and Physical Interval Note:  11/27/2019 5:41 PM  Louis Houston  has presented today for surgery, with the diagnosis of Pulmonary hypertension.  The various methods of treatment have been discussed with the patient and family. After consideration of risks, benefits and other options for treatment, the patient has consented to  Procedure(s): RIGHT HEART CATH (N/A) as a surgical intervention.  The patient's history has been reviewed, patient examined, no change in status, stable for surgery.  I have reviewed the patient's chart and labs.  Questions were answered to the patient's satisfaction.     Aleisa Howk

## 2019-11-28 ENCOUNTER — Encounter (HOSPITAL_COMMUNITY): Payer: Self-pay | Admitting: Internal Medicine

## 2019-11-29 MED FILL — Verapamil HCl IV Soln 2.5 MG/ML: INTRAVENOUS | Qty: 2 | Status: AC

## 2019-12-01 DIAGNOSIS — G4733 Obstructive sleep apnea (adult) (pediatric): Secondary | ICD-10-CM | POA: Diagnosis not present

## 2019-12-01 DIAGNOSIS — I2510 Atherosclerotic heart disease of native coronary artery without angina pectoris: Secondary | ICD-10-CM | POA: Diagnosis not present

## 2019-12-01 DIAGNOSIS — I270 Primary pulmonary hypertension: Secondary | ICD-10-CM | POA: Diagnosis not present

## 2019-12-01 DIAGNOSIS — I10 Essential (primary) hypertension: Secondary | ICD-10-CM | POA: Diagnosis not present

## 2019-12-05 ENCOUNTER — Other Ambulatory Visit: Payer: Self-pay

## 2019-12-05 ENCOUNTER — Telehealth (HOSPITAL_COMMUNITY): Payer: Self-pay | Admitting: Cardiology

## 2019-12-05 ENCOUNTER — Encounter: Payer: Self-pay | Admitting: Cardiology

## 2019-12-05 ENCOUNTER — Ambulatory Visit: Payer: Medicare Other | Admitting: Cardiology

## 2019-12-05 VITALS — BP 116/58 | HR 75 | Ht 70.0 in | Wt 178.1 lb

## 2019-12-05 DIAGNOSIS — I2510 Atherosclerotic heart disease of native coronary artery without angina pectoris: Secondary | ICD-10-CM

## 2019-12-05 DIAGNOSIS — R0609 Other forms of dyspnea: Secondary | ICD-10-CM

## 2019-12-05 DIAGNOSIS — I10 Essential (primary) hypertension: Secondary | ICD-10-CM

## 2019-12-05 DIAGNOSIS — R0600 Dyspnea, unspecified: Secondary | ICD-10-CM

## 2019-12-05 DIAGNOSIS — G4733 Obstructive sleep apnea (adult) (pediatric): Secondary | ICD-10-CM

## 2019-12-05 DIAGNOSIS — I272 Pulmonary hypertension, unspecified: Secondary | ICD-10-CM | POA: Diagnosis not present

## 2019-12-05 DIAGNOSIS — Z951 Presence of aortocoronary bypass graft: Secondary | ICD-10-CM

## 2019-12-05 NOTE — Telephone Encounter (Signed)
Order, OV note, stop bang and demographics all faxed to [employer] at [phone]

## 2019-12-05 NOTE — Progress Notes (Signed)
Cardiology Office Note:    Date:  12/05/2019   ID:  Louis Houston, DOB 12/26/1931, MRN [id number]  PCP:  Nicoletta Dress, MD  Cardiologist:  Jenne Campus, MD    Referring MD: Nicoletta Dress, MD   No chief complaint on file. Still complaining of shortness of breath  History of Present Illness:    Louis Houston is a 86 y.o. male with past medical history significant for coronary artery disease, essential hypertension, dyslipidemia, dyspnea on exertion, pulmonary hypertension.  Recent right-sided cardiac catheterization showed pulmonary hypertension World Health Organization 1/3.  The plan is to give him oxygen right now 3 CPAP and see how he responded if not medication will be reconsidered.  Overall he is still complaining of 5 shortness of breath.  He does have big tank of oxygen at home but he cannot move around too much and that upsets him a lot he is trying to get some portable tank.  No swelling of lower extremities no chest pain tightness squeezing pressure burning chest  No past medical history on file.  Past Surgical History:  Procedure Laterality Date  . CARDIAC SURGERY    . GALLBLADDER SURGERY    . PROSTATE SURGERY    . RIGHT HEART CATH N/A 11/25/2019   Procedure: RIGHT HEART CATH;  Surgeon: Jolaine Artist, MD;  Location: [facility];  Service: Cardiovascular;  Laterality: N/A;    Current Medications: Current Meds  Medication Sig  . amLODipine (NORVASC) 10 MG tablet Take 5 mg by mouth daily.   Marland Kitchen aspirin 81 MG EC tablet Take 81 mg by mouth daily.  . furosemide (LASIX) 40 MG tablet Take 1 tablet (40 mg total) by mouth 2 (two) times daily.  Marland Kitchen glipiZIDE (GLUCOTROL) 5 MG tablet Take 5 mg by mouth 2 (two) times daily.   . hydrALAZINE (APRESOLINE) 25 MG tablet Take 25 mg by mouth 2 (two) times daily.   Marland Kitchen lisinopril (PRINIVIL,ZESTRIL) 40 MG tablet Take 40 mg by mouth at bedtime.   . metFORMIN (GLUCOPHAGE) 500 MG tablet Take 500 mg by mouth daily.   .  metoprolol tartrate (LOPRESSOR) 50 MG tablet Take 50 mg by mouth 2 (two) times daily.   . Multiple Vitamin (MULTI-VITAMINS) TABS Take 1 tablet by mouth daily.  . potassium chloride (KLOR-CON) 10 MEQ tablet Take 1 tablet (10 mEq total) by mouth 2 (two) times daily.  . simvastatin (ZOCOR) 40 MG tablet Take 40 mg by mouth every evening.   Marland Kitchen SPIRIVA HANDIHALER 18 MCG inhalation capsule Place 18 mcg into inhaler and inhale daily.      Allergies:   Flomax [tamsulosin]   Social History   Socioeconomic History  . Marital status: Widowed    Spouse name: Not on file  . Number of children: Not on file  . Years of education: Not on file  . Highest education level: Not on file  Occupational History  . Not on file  Tobacco Use  . Smoking status: Former Research scientist (life sciences)  . Smokeless tobacco: Never Used  Substance and Sexual Activity  . Alcohol use: Not on file  . Drug use: Not on file  . Sexual activity: Not on file  Other Topics Concern  . Not on file  Social History Narrative  . Not on file   Social Determinants of Health   Financial Resource Strain:   . Difficulty of Paying Living Expenses:   Food Insecurity:   . Worried About Charity fundraiser in the  Last Year:   . [place] in the Last Year:   Transportation Needs:   . Film/video editor (Medical):   Marland Kitchen Lack of Transportation (Non-Medical):   Physical Activity:   . Days of Exercise per Week:   . Minutes of Exercise per Session:   Stress:   . Feeling of Stress :   Social Connections:   . Frequency of Communication with Friends and Family:   . Frequency of Social Gatherings with Friends and Family:   . Attends Religious Services:   . Active Member of Clubs or Organizations:   . Attends Archivist Meetings:   Marland Kitchen Marital Status:      Family History: The patient's family history includes Diabetes in his mother and sister. ROS:   Please see the history of present illness.    All 14 point review of systems negative  except as described per history of present illness  EKGs/Labs/Other Studies Reviewed:      Recent Labs: 09/06/2019: NT-Pro BNP 2,176 11/23/2019: BUN 23; Creatinine, Ser 1.31; Platelets 309 11/25/2019: Hemoglobin 9.9; Potassium 4.0; Sodium 138  Recent Lipid Panel No results found for: CHOL, TRIG, HDL, CHOLHDL, VLDL, LDLCALC, LDLDIRECT  Physical Exam:    VS:  BP (!) 116/58 (BP Location: Right Arm, Patient Position: Sitting, Cuff Size: Normal)   Pulse 75   Ht _0  (1.778 m)   Wt 178 lb 1.9 oz (80.8 kg)   SpO2 (!) 88%   BMI 25.56 kg/m     Wt Readings from Last 3 Encounters:  12/05/19 178 lb 1.9 oz (80.8 kg)  11/25/19 184 lb (83.5 kg)  11/23/19 183 lb (83 kg)     GEN:  Well nourished, well developed in no acute distress HEENT: Normal NECK: No JVD; No carotid bruits LYMPHATICS: No lymphadenopathy CARDIAC: RRR, no murmurs, no rubs, no gallops RESPIRATORY:  Clear to auscultation without rales, wheezing or rhonchi  ABDOMEN: Soft, non-tender, non-distended MUSCULOSKELETAL:  No edema; No deformity  SKIN: Warm and dry LOWER EXTREMITIES: no swelling NEUROLOGIC:  Alert and oriented x 3 PSYCHIATRIC:  Normal affect   ASSESSMENT:    1. Pulmonary hypertension (HCC)   2. Essential hypertension   3. Coronary artery disease involving native coronary artery of native heart without angina pectoris   4. Obstructive sleep apnea   5. Dyspnea on exertion   6. Status post coronary artery bypass graft    PLAN:    In order of problems listed above:  1. Pulmonary hypertension world health organization class I/III.  Plan is to treat with oxygen first and see how he responded.  If no positive response then will try medications. 2. Essential hypertension blood pressure well controlled continue present management. 3. Coronary disease stable, status post coronary bypass graft. 4. Dyspnea on exertion related to pulmonary hypertension.  I discussed with his daughter who was present during our  visit.  I explained to her what the diagnosis is with the plan is.  The biggest frustration they have is the fact that have to wait long time for seeing physicians.  He is scheduled to see pulmonary doctor.  Also he is frustrated with the fact that he does not have any reasonable portable tank to walk around with.   Medication Adjustments/Labs and Tests Ordered: Current medicines are reviewed at length with the patient today.  Concerns regarding medicines are outlined above.  No orders of the defined types were placed in this encounter.  Medication changes: No orders of the  defined types were placed in this encounter.   Signed, Park Liter, MD, [facility] 12/05/2019 4:30 PM    [place]

## 2019-12-05 NOTE — Patient Instructions (Signed)

## 2019-12-07 DIAGNOSIS — R338 Other retention of urine: Secondary | ICD-10-CM | POA: Diagnosis not present

## 2019-12-07 DIAGNOSIS — N35013 Post-traumatic anterior urethral stricture: Secondary | ICD-10-CM | POA: Diagnosis not present

## 2019-12-12 ENCOUNTER — Other Ambulatory Visit: Payer: Self-pay | Admitting: Cardiology

## 2019-12-15 DIAGNOSIS — J449 Chronic obstructive pulmonary disease, unspecified: Secondary | ICD-10-CM | POA: Diagnosis not present

## 2019-12-15 DIAGNOSIS — I10 Essential (primary) hypertension: Secondary | ICD-10-CM | POA: Diagnosis not present

## 2019-12-19 DIAGNOSIS — Z9181 History of falling: Secondary | ICD-10-CM | POA: Diagnosis not present

## 2019-12-19 DIAGNOSIS — Z0000 Encounter for general adult medical examination without abnormal findings: Secondary | ICD-10-CM | POA: Diagnosis not present

## 2019-12-19 DIAGNOSIS — E785 Hyperlipidemia, unspecified: Secondary | ICD-10-CM | POA: Diagnosis not present

## 2019-12-20 ENCOUNTER — Other Ambulatory Visit: Payer: Self-pay

## 2019-12-20 ENCOUNTER — Ambulatory Visit (HOSPITAL_COMMUNITY)
Admission: RE | Admit: 2019-12-20 | Discharge: 2019-12-20 | Disposition: A | Discharge status: Home / Self Care | Payer: Medicare Other | Source: Ambulatory Visit | Attending: Internal Medicine | Admitting: Internal Medicine

## 2019-12-20 DIAGNOSIS — R0602 Shortness of breath: Secondary | ICD-10-CM | POA: Insufficient documentation

## 2019-12-20 DIAGNOSIS — I700 Atherosclerosis of aorta: Secondary | ICD-10-CM | POA: Diagnosis not present

## 2019-12-20 DIAGNOSIS — J432 Centrilobular emphysema: Secondary | ICD-10-CM | POA: Diagnosis not present

## 2019-12-20 DIAGNOSIS — J9809 Other diseases of bronchus, not elsewhere classified: Secondary | ICD-10-CM | POA: Diagnosis not present

## 2019-12-20 DIAGNOSIS — J40 Bronchitis, not specified as acute or chronic: Secondary | ICD-10-CM | POA: Diagnosis not present

## 2019-12-23 DIAGNOSIS — N281 Cyst of kidney, acquired: Secondary | ICD-10-CM | POA: Diagnosis not present

## 2019-12-23 DIAGNOSIS — R310 Gross hematuria: Secondary | ICD-10-CM | POA: Diagnosis not present

## 2019-12-23 DIAGNOSIS — I700 Atherosclerosis of aorta: Secondary | ICD-10-CM | POA: Diagnosis not present

## 2019-12-23 DIAGNOSIS — R338 Other retention of urine: Secondary | ICD-10-CM | POA: Diagnosis not present

## 2019-12-23 DIAGNOSIS — N3289 Other specified disorders of bladder: Secondary | ICD-10-CM | POA: Diagnosis not present

## 2019-12-23 DIAGNOSIS — N3000 Acute cystitis without hematuria: Secondary | ICD-10-CM | POA: Diagnosis not present

## 2019-12-30 DIAGNOSIS — R338 Other retention of urine: Secondary | ICD-10-CM | POA: Diagnosis not present

## 2019-12-30 DIAGNOSIS — R310 Gross hematuria: Secondary | ICD-10-CM | POA: Diagnosis not present

## 2020-01-01 DIAGNOSIS — G4733 Obstructive sleep apnea (adult) (pediatric): Secondary | ICD-10-CM | POA: Diagnosis not present

## 2020-01-01 DIAGNOSIS — I10 Essential (primary) hypertension: Secondary | ICD-10-CM | POA: Diagnosis not present

## 2020-01-01 DIAGNOSIS — I270 Primary pulmonary hypertension: Secondary | ICD-10-CM | POA: Diagnosis not present

## 2020-01-01 DIAGNOSIS — I2510 Atherosclerotic heart disease of native coronary artery without angina pectoris: Secondary | ICD-10-CM | POA: Diagnosis not present

## 2020-01-06 DIAGNOSIS — Z87891 Personal history of nicotine dependence: Secondary | ICD-10-CM | POA: Diagnosis not present

## 2020-01-06 DIAGNOSIS — I509 Heart failure, unspecified: Secondary | ICD-10-CM | POA: Diagnosis not present

## 2020-01-06 DIAGNOSIS — J449 Chronic obstructive pulmonary disease, unspecified: Secondary | ICD-10-CM | POA: Diagnosis not present

## 2020-01-06 DIAGNOSIS — J439 Emphysema, unspecified: Secondary | ICD-10-CM | POA: Diagnosis not present

## 2020-01-06 DIAGNOSIS — E119 Type 2 diabetes mellitus without complications: Secondary | ICD-10-CM | POA: Diagnosis not present

## 2020-01-06 DIAGNOSIS — I959 Hypotension, unspecified: Secondary | ICD-10-CM | POA: Diagnosis not present

## 2020-01-06 DIAGNOSIS — I110 Hypertensive heart disease with heart failure: Secondary | ICD-10-CM | POA: Diagnosis not present

## 2020-01-06 DIAGNOSIS — R531 Weakness: Secondary | ICD-10-CM | POA: Diagnosis not present

## 2020-01-06 DIAGNOSIS — R0602 Shortness of breath: Secondary | ICD-10-CM | POA: Diagnosis not present

## 2020-01-10 ENCOUNTER — Telehealth (HOSPITAL_COMMUNITY): Payer: Self-pay | Admitting: Cardiology

## 2020-01-10 ENCOUNTER — Telehealth (HOSPITAL_COMMUNITY): Payer: Self-pay

## 2020-01-10 NOTE — Telephone Encounter (Signed)
I dont see an ED visit at [facility].   He has been seen by Dr Agustin Cree. On 12/05/19.  Continue current dose of lasix.. Ask them to record weights. If weight goes up 3-4 pounds call us back.   Please set up for follow up with [place] or with Dr Agustin Cree.   Tanja Gift Np-C  3:51 PM

## 2020-01-10 NOTE — Telephone Encounter (Signed)
-----  Message from Jolaine Artist, MD sent at 01/06/2020 11:23 AM EDT ----- Significant lung disease. Has referral to Pulmonary

## 2020-01-10 NOTE — Telephone Encounter (Signed)
Dr. Clayborne Dana patient

## 2020-01-10 NOTE — Telephone Encounter (Signed)
Patients daughter [name], called to report patient was sent to the ER this weekend for decreased UOP, low b/p 85/48 at home, and increased weakness.  ER gave dose of lasix, urine culture +for bacteria (treated with Levaquin and keflex) and told family to follow up with cardiology regarding increased fluid  Daughter reports patient is doing so much better since starting antibiotics and lasix.   Would like to know if he needs to increase lasix?

## 2020-01-10 NOTE — Telephone Encounter (Signed)
Malena Edman, RN  01/10/2020 4:16 PM EDT Back to Top    Pt and daughter advised. Pt agreeable and verbalized understanding. Pt has an appointment with Pulm next week

## 2020-01-12 NOTE — Telephone Encounter (Signed)
Attempted to return call to patients daughter  No answer  [facility]

## 2020-01-12 NOTE — Telephone Encounter (Signed)
Patients daughter [name] advised and verbalized understanding,fu appt scheduled

## 2020-01-16 ENCOUNTER — Other Ambulatory Visit: Payer: Self-pay | Admitting: Internal Medicine

## 2020-01-16 ENCOUNTER — Encounter: Payer: Self-pay | Admitting: Internal Medicine

## 2020-01-16 ENCOUNTER — Other Ambulatory Visit: Payer: Self-pay

## 2020-01-16 ENCOUNTER — Telehealth: Payer: Self-pay | Admitting: Internal Medicine

## 2020-01-16 ENCOUNTER — Other Ambulatory Visit (INDEPENDENT_AMBULATORY_CARE_PROVIDER_SITE_OTHER): Payer: Medicare Other

## 2020-01-16 ENCOUNTER — Ambulatory Visit: Payer: Medicare Other | Admitting: Internal Medicine

## 2020-01-16 VITALS — BP 110/68 | HR 78 | Temp 97.5°F | Ht 70.0 in | Wt 177.8 lb

## 2020-01-16 DIAGNOSIS — R0600 Dyspnea, unspecified: Secondary | ICD-10-CM | POA: Diagnosis not present

## 2020-01-16 DIAGNOSIS — J439 Emphysema, unspecified: Secondary | ICD-10-CM

## 2020-01-16 DIAGNOSIS — R0609 Other forms of dyspnea: Secondary | ICD-10-CM

## 2020-01-16 DIAGNOSIS — J920 Pleural plaque with presence of asbestos: Secondary | ICD-10-CM | POA: Diagnosis not present

## 2020-01-16 DIAGNOSIS — J849 Interstitial pulmonary disease, unspecified: Secondary | ICD-10-CM

## 2020-01-16 DIAGNOSIS — I272 Pulmonary hypertension, unspecified: Secondary | ICD-10-CM

## 2020-01-16 DIAGNOSIS — Z862 Personal history of diseases of the blood and blood-forming organs and certain disorders involving the immune mechanism: Secondary | ICD-10-CM

## 2020-01-16 DIAGNOSIS — N189 Chronic kidney disease, unspecified: Secondary | ICD-10-CM

## 2020-01-16 LAB — CBC
HCT: 36.4 % — ABNORMAL LOW (ref 39.0–52.0)
Hemoglobin: 12.3 g/dL — ABNORMAL LOW (ref 13.0–17.0)
MCHC: 33.9 g/dL (ref 30.0–36.0)
MCV: 90.7 fl (ref 78.0–100.0)
Platelets: 385 10*3/uL (ref 150.0–400.0)
RBC: 4.01 Mil/uL — ABNORMAL LOW (ref 4.22–5.81)
RDW: 14.4 % (ref 11.5–15.5)
WBC: 12.3 10*3/uL — ABNORMAL HIGH (ref 4.0–10.5)

## 2020-01-16 MED ORDER — FLUTICASONE FUROATE-VILANTEROL 100-25 MCG/INH IN AEPB: 1.0000 | INHALATION_SPRAY | Freq: Every day | RESPIRATORY_TRACT | Status: DC

## 2020-01-16 MED ORDER — FLUTICASONE FUROATE-VILANTEROL 100-25 MCG/INH IN AEPB: 1.0000 | INHALATION_SPRAY | Freq: Every day | RESPIRATORY_TRACT | 3 refills | Status: DC

## 2020-01-16 NOTE — Telephone Encounter (Signed)
Order has been changed an faxed to [employer] I have also called the Daughter [name]

## 2020-01-16 NOTE — Progress Notes (Signed)
OV 01/16/2020  Subjective:  Patient ID: Louis Houston, male , DOB: [birth year] , age 86 y.o. , MRN: [id number] , ADDRESS: [address] [place] [address]   01/16/2020 -   Chief Complaint  Patient presents with  . Consult    pt has sob doing exertion.pt has chf     HPI Louis Houston 86 y.o. -retired former Sales promotion account executive.  Non-smoker.  Brought in by his daughter.  History is given by him reviewed the chart and talking to the daughter.  It appears patient was quite active up until few years ago.  He had bypass surgery in his early 54s.  His wife died a few years ago and after that he had overall decline in health status when he started using the cane.  Simultaneously the last few years has had insidious onset of shortness of breath but been progressively getting worse.  But he became aware only in the last few months to several months about hypoxemia at night and has been on nighttime oxygen.  Daughter also reports that he easily desaturates when he exerts himself.  They really want a portable oxygen system but it has been very difficult to get 1 that is light.  They are looking at the [facility] system but they are in need of a prescription for this.  He and his daughter also report that a few months ago [?  May 2021] he was started on Spiriva.  Then approximately few weeks later started having bladder issues including hematuria.  [facility] is [facility] and has been told that his bladder muscle wall is weak and is on a permanent Foley catheter.  However per the history it appears that the bladder issues started after starting Spiriva.  It appears that Spiriva might be helping with the shortness of breath.  Overall with all this he is also gotten progressively more deconditioned.  At this point in time he lives alone with his dog but at night his children take turns sleeping in his house and checking on him once in the evening.  He had echocardiogram that suggested  pulmonary hypertension.  He then saw Dr. Haroldine Laws who did a right heart catheterization which was done at the end of July 2021.  He is deemed to have group 3 PAH with a normal wedge pressure and a PVR greater than 7 and a PAP mean of 39.   He had a high-resolution CT chest that shows emphysema associated with possible mild ILD with pleural plaques suggestive of asbestos exposure.  He reports significant asbestos exposure during his work years   Your pulmonary function test at [facility] shows a mixture of restriction/obstruction.  FEV1 1.43 L / 54%.  FVC 2.15 L / 56% with a ratio of 66.  Do not have lung volumes or DLCO.  He seems to have chronic kidney disease and anemia  Of note he has a chronic Foley catheter last week he was admitted for bacterial infection   Results for Louis Houston, Louis Houston (MRN [id number]) as of 01/16/2020 11:01  Ref. Range 11/09/2019 02:01 11/23/2019 12:24 11/25/2019 14:35 11/25/2019 14:36  Hemoglobin Latest Ref Range: 13.0 - 17.0 g/dL 12.7 (L) 11.7 (L) 9.5 (L) 9.9 (L)     Results for Louis Houston, Louis Houston (MRN [id number]) as of 01/16/2020 11:01  Ref. Range 09/06/2019 11:48 11/09/2019 02:01 11/23/2019 12:23 11/25/2019 14:35 11/25/2019 14:36  Creatinine Latest Ref Range: 0.61 - 1.24 mg/dL 1.17 1.30 (H) 1.31 (H)  SYMPTOM SCALE - ILD 01/16/2020   O2 use 2L [facility] only  Shortness of Breath 0 -> 5 scale with 5 being worst (score 6 If unable to do)  At rest x  Simple tasks - showers, clothes change, eating, shaving x  Household (dishes, doing bed, laundry) x  Shopping xx  Walking level at own pace x  Walking up Stairs x  Total (30-36) Dyspnea Score x  How bad is your cough? x  How bad is your fatigue x  How bad is nausea x  How bad is vomiting?  x  How bad is diarrhea? x  How bad is anxiety? x  How bad is depression x    Simple office walk 185 feet x  3 laps goal with forehead probe 01/16/2020   O2 used ra  Number laps completed 1 of 3 only and then desaturated  Comments  about pace Slow with cane  Resting Pulse Ox/HR 97% and 68/min  Final Pulse Ox/HR 88% and 65/min  Desaturated </= 88% yes  Desaturated <= 3% points Yes, 9 points  Got Tachycardic >/= 90/min 9  Symptoms at end of test no  Miscellaneous comments Dyspneic, needed 2L [place] to correct        ECHO 08/26/19   IMPRESSIONS    1. There is prominent septal bounce, consider constriction. Left  ventricular ejection fraction, by estimation, is 55 to 60%. The left  ventricle has normal function. The left ventricle has no regional wall  motion abnormalities. Left ventricular diastolic  parameters are consistent with Grade I diastolic dysfunction (impaired  relaxation). Elevated left ventricular end-diastolic pressure.  2. Right ventricular systolic function is normal. The right ventricular  size is severely enlarged. There is severely elevated pulmonary artery  systolic pressure. The estimated right ventricular systolic pressure is  34.7 mmHg.  3. Left atrial size was mildly dilated.  4. Right atrial size was moderately dilated.  5. The mitral valve is normal in structure. Trivial mitral valve  regurgitation. No evidence of mitral stenosis.  6. Tricuspid valve regurgitation is moderate.  7. The aortic valve is tricuspid. Aortic valve regurgitation is not  visualized. No aortic stenosis is present.  8. The inferior vena cava is dilated in size with <50% respiratory  variability, suggesting right atrial pressure of 15 mmHg.   Conclusion(s)/Recommendation(s): Severe pulmonary hypertension, with RV  dilation. Prominent ventricular septal bounce, consider constriction.    IMPRESSION: CT chest high resolution 1. Limited motion degraded scan. 2. Moderate centrilobular and paraseptal emphysema with diffuse bronchial wall thickening, suggesting COPD. 3. Stable mild smooth bilateral pleural thickening posteriorly with associated faint pleural calcifications, cannot exclude asbestos related  pleural disease. No pleural effusions. 4. Suggestion of mild subpleural reticulation and ground-glass opacity at the lung bases, poorly evaluated due to motion degradation, not definitely changed since 09/06/2019 high-resolution chest CT study. No definite regions of traction bronchiectasis or frank honeycombing. A mild interstitial lung disease such as early asbestosis or UIP cannot be excluded. Consider follow-up high-resolution chest CT study in 6-12 months, as clinically warranted. Findings are indeterminate for UIP per consensus guidelines: Diagnosis of Idiopathic Pulmonary Fibrosis: An Official ATS/ERS/JRS/ALAT Clinical Practice Guideline. [place], Iss 5, [phone], Jan 03 2017. 5. Stable mild cardiomegaly. Stable dilated main pulmonary artery, suggesting chronic pulmonary arterial hypertension. 6. Aortic Atherosclerosis (ICD10-I70.0) and Emphysema (ICD10-J43.9).   Electronically Signed   By: Ilona Sorrel M.D.   On: 12/21/2019 10:51   ROS - per HPI  No flowsheet data found.  RIGHT HEART CATH - 11/25/2019  Findings:  RA = 8 RV = 68/7 PA = 71/21 (39) PCW = 7 Fick cardiac output/index = 4.2/2.1 PVR = 7.6 WU Ao sat = 99% PA sat = 59%, 60%  Assessment: 1. Mild to moderate PAH with normal left-sided pressures  Plan/Discussion:  Suspect primarily WHO Group 3 PAH but may have component of WHO Group 1. Need to make sure he is complaint with supplemental O2 and CPAP. Once hypoxemia addressed will repeat echo. If still with RV strain can consider careful trial of PDE-5.  Glori Bickers, MD  2:47 PM     has no past medical history on file.   reports that he has quit smoking. He has never used smokeless tobacco.  Past Surgical History:  Procedure Laterality Date  . CARDIAC SURGERY    . GALLBLADDER SURGERY    . PROSTATE SURGERY    . RIGHT HEART CATH N/A 11/25/2019   Procedure: RIGHT HEART CATH;  Surgeon: Jolaine Artist, MD;   Location: [facility];  Service: Cardiovascular;  Laterality: N/A;    Allergies  Allergen Reactions  . Flomax [Tamsulosin] Other (See Comments)    Drop blood pressure    Immunization History  Administered Date(s) Administered  . Influenza-Unspecified 03/05/2018, 01/11/2019  . PFIZER SARS-COV-2 Vaccination 05/11/2019, 06/11/2019    Family History  Problem Relation Age of Onset  . Diabetes Mother   . Diabetes Sister      Current Outpatient Medications:  .  amLODipine (NORVASC) 10 MG tablet, Take 5 mg by mouth daily. , Disp: , Rfl:  .  aspirin 81 MG EC tablet, Take 81 mg by mouth daily., Disp: , Rfl: 12 .  furosemide (LASIX) 40 MG tablet, TAKE ONE TABLET BY MOUTH TWICE DAILY, Disp: 60 tablet, Rfl: 3 .  glipiZIDE (GLUCOTROL) 5 MG tablet, Take 5 mg by mouth 2 (two) times daily. , Disp: , Rfl:  .  hydrALAZINE (APRESOLINE) 25 MG tablet, Take 25 mg by mouth 2 (two) times daily. , Disp: , Rfl: 12 .  lisinopril (PRINIVIL,ZESTRIL) 40 MG tablet, Take 40 mg by mouth at bedtime. , Disp: , Rfl:  .  metFORMIN (GLUCOPHAGE) 500 MG tablet, Take 500 mg by mouth daily. , Disp: , Rfl:  .  metoprolol tartrate (LOPRESSOR) 50 MG tablet, Take 50 mg by mouth 2 (two) times daily. , Disp: , Rfl:  .  Multiple Vitamin (MULTI-VITAMINS) TABS, Take 1 tablet by mouth daily., Disp: , Rfl:  .  potassium chloride (KLOR-CON) 10 MEQ tablet, Take 1 tablet (10 mEq total) by mouth 2 (two) times daily., Disp: 60 tablet, Rfl: 1 .  simvastatin (ZOCOR) 40 MG tablet, Take 40 mg by mouth every evening. , Disp: , Rfl:  .  SPIRIVA HANDIHALER 18 MCG inhalation capsule, Place 18 mcg into inhaler and inhale daily. , Disp: , Rfl:       Objective:   Vitals:   01/16/20 1037  BP: 110/68  Pulse: 78  Temp: (!) 97.5 F (36.4 C)  TempSrc: Oral  SpO2: 94%  Weight: 177 lb 12.8 oz (80.6 kg)  Height: _0  (1.778 m)    Estimated body mass index is 25.51 kg/m as calculated from the following:   Height as of this  encounter: _1  (1.778 m).   Weight as of this encounter: 177 lb 12.8 oz (80.6 kg).  _2 @  Filed Weights   01/16/20 1037  Weight: 177 lb 12.8 oz (80.6 kg)  Physical Exam  General Appearance:    Alert, cooperative, no distress, appears stated age - yes , Deconditioned looking - yes , OBESE  - no, Sitting on Wheelchair -  no  Head:    Normocephalic, without obvious abnormality, atraumatic  Eyes:    PERRL, conjunctiva/corneas clear,  Ears:    Normal TM's and external ear canals, both ears  Nose:   Nares normal, septum midline, mucosa normal, no drainage    or sinus tenderness. OXYGEN ON  - no . Patient is @ ra   Throat:   Lips, mucosa, and tongue normal; teeth and gums normal. Cyanosis on lips - no  Neck:   Supple, symmetrical, trachea midline, no adenopathy;    thyroid:  no enlargement/tenderness/nodules; no carotid   bruit or JVD  Back:     Symmetric, no curvature, ROM normal, no CVA tenderness  Lungs:     Distress - no , Wheeze no, Barrell Chest - no, Purse lip breathing - no, Crackles - no   Chest Wall:    No tenderness or deformity.    Heart:    Regular rate and rhythm, S1 and S2 normal, no rub   or gallop, Murmur - no  Breast Exam:    NOT DONE  Abdomen:     Soft, non-tender, bowel sounds active all four quadrants,    no masses, no organomegaly. Visceral obesity - no  Genitalia:   NOT DONE  Rectal:   NOT DONE  Extremities:   Extremities - normal, Has Cane - yes, Clubbing - no, Edema - no  Pulses:   2+ and symmetric all extremities  Skin:   Stigmata of Connective Tissue Disease - no  Lymph nodes:   Cervical, supraclavicular, and axillary nodes normal  Psychiatric:  Neurologic:   Pleasant - yes, Anxious - no, Flat affect - no  CAm-ICU - neg, Alert and Oriented x 3 - yes, Moves all 4s - yes, Speech - normal, Cognition - intact           Assessment:       ICD-10-CM   1. Dyspnea on exertion  R06.00   2. Pulmonary hypertension (HCC)  I27.20   3. Pulmonary  emphysema, unspecified emphysema type ([place])  J43.9   4. 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 ILD (interstitial lung disease) ([place])  J84.9   6. History of anemia  Z86.2   7. Chronic kidney disease, unspecified CKD stage  N18.9        Plan:     Patient Instructions     ICD-10-CM   1. Dyspnea on exertion  R06.00   2. Pulmonary hypertension (HCC)  I27.20   3. Pulmonary emphysema, unspecified emphysema type ([place])  J43.9   4. 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 ILD (interstitial lung disease) ([place])  J84.9   6. History of anemia  Z86.2   7. Chronic kidney disease, unspecified CKD stage  N18.9    You have multiple chronic pulmonary cardiac issues causing pulmonary hypertension and hypoxemia  Plan -Overall goal is quality of life and improvement in symptoms -Currently talk to urologist and see if Spiriva is playing a role in bladder issues  -Can stop Spiriva for few weeks and see if the bladder issues improve -Start Breo daily -Meet with [facility] to start [employer] portable oxygen system 2 L nasal cannula oxygen with  Exertion  -Please let them know you plan to pay for this  out of pocket -Check blood work for ANA, rheumatoid factor, SCL-70, SSA, SSB and CCP -Check blood work today for CBC and chemistry -Continue oxygen at night as before -At this visit we will not address inhaled treprostinil or Tyvaso for pulmonary hypertension  -We can do this in the future  Followup  -Return in 4-8 weeks to report progress with the symptoms and quality of life  -Okay to visit with nurse practitioner    ( Level 05 visit:  New 60-74 min   in  visit type: on-site physical face to visit  in total care time and counseling or/and coordination of care by this undersigned MD - Dr Brand Males. This includes one or more of the following on this same day 01/16/2020: pre-charting, chart review, note writing, documentation discussion of test results, diagnostic or  treatment recommendations, prognosis, risks and benefits of management options, instructions, education, compliance or risk-factor reduction. It excludes time spent by the [facility] or office staff in the care of the patient. Actual time 79 min)   SIGNATURE    Dr. Brand Males, M.D., F.C.C.P,  Pulmonary and Critical Care Medicine Staff Physician, [place] Director - Interstitial Lung Disease  Program  Pulmonary [place] [facility] [place], [place], [address]  Pager: [phone], If no answer or between  15:00h - 7:00h: call [phone] Telephone: [phone]  11:38 AM 01/16/2020

## 2020-01-16 NOTE — Telephone Encounter (Signed)
Pt was contacted working with chan one of [facility] to find out best course of action to get concentrator to patient

## 2020-01-16 NOTE — Addendum Note (Signed)
Addended by: Satira Sark D on: 01/16/2020 04:27 PM   Modules accepted: Orders

## 2020-01-16 NOTE — Telephone Encounter (Signed)
[name] called back regarding preferred DME company. They would like to go w/ [employer] b/c they carry a smaller O2 concentrator. [name] can be reached at [phone]

## 2020-01-16 NOTE — Patient Instructions (Addendum)
ICD-10-CM   1. Dyspnea on exertion  R06.00   2. Pulmonary hypertension (HCC)  I27.20   3. Pulmonary emphysema, unspecified emphysema type ([place])  J43.9   4. 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 ILD (interstitial lung disease) ([place])  J84.9   6. History of anemia  Z86.2   7. Chronic kidney disease, unspecified CKD stage  N18.9    You have multiple chronic pulmonary cardiac issues causing pulmonary hypertension and hypoxemia  Plan -Overall goal is quality of life and improvement in symptoms -Currently talk to urologist and see if Spiriva is playing a role in bladder issues  -Can stop Spiriva for few weeks and see if the bladder issues improve -Start Breo daily -Meet with [facility] to start INNOGEN portable oxygen system 2 L nasal cannula oxygen with  Exertion  -Please let them know you plan to pay for this out of pocket -Check blood work for ANA, rheumatoid factor, SCL-70, SSA, SSB and CCP -Check blood work today for CBC and chemistry -Continue oxygen at night as before -At this visit we will not address inhaled treprostinil or Tyvaso for pulmonary hypertension  -We can do this in the future  Followup  -Return in 4-8 weeks to report progress with the symptoms and quality of life  -Okay to visit with nurse practitioner

## 2020-01-18 LAB — ANTI-NUCLEAR AB-TITER (ANA TITER): ANA Titer 1: 1:320 {titer} — ABNORMAL HIGH

## 2020-01-18 LAB — ANA: Anti Nuclear Antibody (ANA): POSITIVE — AB

## 2020-01-18 LAB — CYCLIC CITRUL PEPTIDE ANTIBODY, IGG: Cyclic Citrullin Peptide Ab: <16 UNITS

## 2020-01-18 LAB — RHEUMATOID FACTOR: Rheumatoid fact SerPl-aCnc: <14 IU/mL (ref <14)

## 2020-01-30 ENCOUNTER — Other Ambulatory Visit: Payer: Self-pay

## 2020-01-30 ENCOUNTER — Ambulatory Visit (HOSPITAL_COMMUNITY)
Admission: RE | Admit: 2020-01-30 | Discharge: 2020-01-30 | Disposition: A | Discharge status: Home / Self Care | Payer: Medicare Other | Source: Ambulatory Visit | Attending: Cardiology | Admitting: Cardiology

## 2020-01-30 ENCOUNTER — Encounter (HOSPITAL_COMMUNITY): Payer: Self-pay

## 2020-01-30 VITALS — BP 114/60 | HR 65 | Ht 70.0 in | Wt 180.6 lb

## 2020-01-30 DIAGNOSIS — I25810 Atherosclerosis of coronary artery bypass graft(s) without angina pectoris: Secondary | ICD-10-CM | POA: Diagnosis not present

## 2020-01-30 DIAGNOSIS — Z7982 Long term (current) use of aspirin: Secondary | ICD-10-CM | POA: Diagnosis not present

## 2020-01-30 DIAGNOSIS — Z87891 Personal history of nicotine dependence: Secondary | ICD-10-CM | POA: Insufficient documentation

## 2020-01-30 DIAGNOSIS — Z7984 Long term (current) use of oral hypoglycemic drugs: Secondary | ICD-10-CM | POA: Diagnosis not present

## 2020-01-30 DIAGNOSIS — Z951 Presence of aortocoronary bypass graft: Secondary | ICD-10-CM | POA: Insufficient documentation

## 2020-01-30 DIAGNOSIS — Z7951 Long term (current) use of inhaled steroids: Secondary | ICD-10-CM | POA: Insufficient documentation

## 2020-01-30 DIAGNOSIS — Z960 Presence of urogenital implants: Secondary | ICD-10-CM | POA: Insufficient documentation

## 2020-01-30 DIAGNOSIS — Z9119 Patient's noncompliance with other medical treatment and regimen: Secondary | ICD-10-CM | POA: Diagnosis not present

## 2020-01-30 DIAGNOSIS — G4733 Obstructive sleep apnea (adult) (pediatric): Secondary | ICD-10-CM | POA: Insufficient documentation

## 2020-01-30 DIAGNOSIS — I10 Essential (primary) hypertension: Secondary | ICD-10-CM | POA: Diagnosis not present

## 2020-01-30 DIAGNOSIS — I272 Pulmonary hypertension, unspecified: Secondary | ICD-10-CM | POA: Insufficient documentation

## 2020-01-30 DIAGNOSIS — E119 Type 2 diabetes mellitus without complications: Secondary | ICD-10-CM | POA: Diagnosis not present

## 2020-01-30 DIAGNOSIS — J989 Respiratory disorder, unspecified: Secondary | ICD-10-CM | POA: Diagnosis not present

## 2020-01-30 DIAGNOSIS — Z79899 Other long term (current) drug therapy: Secondary | ICD-10-CM | POA: Diagnosis not present

## 2020-01-30 LAB — BASIC METABOLIC PANEL
Anion gap: 12 (ref 5–15)
BUN: 24 mg/dL — ABNORMAL HIGH (ref 8–23)
CO2: 29 mmol/L (ref 22–32)
Calcium: 9.3 mg/dL (ref 8.9–10.3)
Chloride: 97 mmol/L — ABNORMAL LOW (ref 98–111)
Creatinine, Ser: 1.41 mg/dL — ABNORMAL HIGH (ref 0.61–1.24)
GFR calc Af Amer: 52 mL/min — ABNORMAL LOW (ref >60)
GFR calc non Af Amer: 45 mL/min — ABNORMAL LOW (ref >60)
Glucose, Bld: 130 mg/dL — ABNORMAL HIGH (ref 70–99)
Potassium: 3.6 mmol/L (ref 3.5–5.1)
Sodium: 138 mmol/L (ref 135–145)

## 2020-01-30 NOTE — Progress Notes (Signed)
ADVANCED [facility] CLINIC PROGRESS NOTE  Referring Physician: Agustin Cree   Primary Cardiologist: Agustin Cree [facility]: Dr. Haroldine Laws   HPI:  Louis Houston is a 86 y.o. male with CAD s/p CABG, DM2, HTN. HL and OSA. Referred by Dr. Agustin Cree for further evaluation of pulmonary HTN seen on echo   He has been following with Dr. Raliegh Ip for years and has had stable mild to moderate PAH on echo without many symptoms. Recently more DOE. Repeat echo 4/21 as below. Attempts at diuresis made but not very successful.   CT scan by PCP. No PE.  His primary care physician also did pulmonary function testing which showed moderate severe obstructive airways disease and he was given some bronchodilator. ( I do not have the details on this).  Other history: Worked in Architect. Retired at age 89. Was former smoker for ~ 20 yrs 1-2ppd. Quit in 1966 due to recurrent colds. Has been relatively active. Has had increasing SOB for 6 months to 1 year. Gets SOB with very minimal activity. Unable to go to mailbox. No syncope or presyncope. Wears 3L O2 at night. Does not use CPAP. Last sleep study was > 10 years ago.   He had initial consult w/ Dr. Haroldine Laws 7/21. He arranged RHC which showed mild to moderate PAH with normal left-sided pressures. Suspect primarily WHO Group 3 PAH but may have component of WHO Group 1. Recommendations were to manage hypoxia w/ improved compliance w/ home O2 and treatment w/ CPAP w/ plans to repeat echo after hypoxemia was better corrected. If still w/ evidence of RV strain, plan was for careful trial of PDE5i therapy. He was also ordered to get repeat sleep study (home itamar). This has not yet been completed. Has [employer] medicare.   He presents back to clinic today w/ his daughter [name]. He is wearing supp O2  At 3L/min. Still has not had sleep study done yet. Awaiting insurance approval. Respiratory status remains the same. Continues on 3L [place]. No increased supp O2 requirements. Continues w/ NYHA Class III  symptoms. He reports that he was seen in the ER at [facility] for weakness and hypotension on 9/3 and found to have a UTI and was placed on abx. He was also felt to be mildly fluid overloaded and was given a dose of IV Lasix in the ED. He did not require admission. He was sent home on 7 day course of abx and instructed to continue lasix 40 mg bid. He has been stable since that time. Home wts have been stable ~176-178 lb. No dysuria, fever of chills.      [place] 11/25/19  Findings:   RA = 8 RV = 68/7 PA = 71/21 (39) PCW = 7 Fick cardiac output/index = 4.2/2.1 PVR = 7.6 WU Ao sat = 99% PA sat = 59%, 60%  Assessment: 1. Mild to moderate PAH with normal left-sided pressures  Plan/Discussion:  Suspect primarily WHO Group 3 PAH but may have component of WHO Group 1. Need to make sure he is complaint with supplemental O2 and CPAP. Once hypoxemia addressed will repeat echo. If still with RV strain can consider careful trial of PDE-5.    ECHO 08/26/19 1. There is prominent septal bounce, consider constriction. Left  ventricular ejection fraction, by estimation, is 55 to 60%. The left  ventricle has normal function. The left ventricle has no regional wall  motion abnormalities. Left ventricular diastolic  parameters are consistent with Grade I diastolic dysfunction (impaired  relaxation). Elevated left  ventricular end-diastolic pressure.  2. Right ventricular systolic function is normal. The right ventricular  size is severely enlarged. There is severely elevated pulmonary artery  systolic pressure. The estimated right ventricular systolic pressure is  10.2 mmHg.  3. Left atrial size was mildly dilated.  4. Right atrial size was moderately dilated.  5. The mitral valve is normal in structure. Trivial mitral valve  regurgitation. No evidence of mitral stenosis.  6. Tricuspid valve regurgitation is moderate.  7. The aortic valve is tricuspid. Aortic valve regurgitation is  not  visualized. No aortic stenosis is present.  8. The inferior vena cava is dilated in size with <50% respiratory  variability, suggesting right atrial pressure of 15 mmHg.   Review of systems complete and found to be negative unless listed in HPI.       Current Outpatient Medications  Medication Sig Dispense Refill  . amLODipine (NORVASC) 10 MG tablet Take 5 mg by mouth daily.     Marland Kitchen aspirin 81 MG EC tablet Take 81 mg by mouth daily.  12  . fluticasone furoate-vilanterol (BREO ELLIPTA) 100-25 MCG/INH AEPB Inhale 1 puff into the lungs daily. 28 each 3  . furosemide (LASIX) 40 MG tablet TAKE ONE TABLET BY MOUTH TWICE DAILY 60 tablet 3  . glipiZIDE (GLUCOTROL) 5 MG tablet Take 5 mg by mouth 2 (two) times daily.     . hydrALAZINE (APRESOLINE) 25 MG tablet Take 25 mg by mouth 2 (two) times daily.   12  . lisinopril (PRINIVIL,ZESTRIL) 40 MG tablet Take 40 mg by mouth at bedtime.     . metFORMIN (GLUCOPHAGE) 500 MG tablet Take 500 mg by mouth daily.     . metoprolol tartrate (LOPRESSOR) 50 MG tablet Take 50 mg by mouth 2 (two) times daily.     . Multiple Vitamin (MULTI-VITAMINS) TABS Take 1 tablet by mouth daily.    . potassium chloride (KLOR-CON) 10 MEQ tablet Take 1 tablet (10 mEq total) by mouth 2 (two) times daily. 60 tablet 1  . simvastatin (ZOCOR) 40 MG tablet Take 40 mg by mouth every evening.      No current facility-administered medications for this encounter.    Allergies  Allergen Reactions  . Flomax [Tamsulosin] Other (See Comments)    Drop blood pressure      Social History   Socioeconomic History  . Marital status: Widowed    Spouse name: Not on file  . Number of children: Not on file  . Years of education: Not on file  . Highest education level: Not on file  Occupational History  . Not on file  Tobacco Use  . Smoking status: Former Research scientist (life sciences)  . Smokeless tobacco: Never Used  Substance and Sexual Activity  . Alcohol use: Not Currently  . Drug use: Not on file   . Sexual activity: Not on file  Other Topics Concern  . Not on file  Social History Narrative  . Not on file   Social Determinants of Health   Financial Resource Strain:   . Difficulty of Paying Living Expenses: Not on file  Food Insecurity:   . Worried About Charity fundraiser in the Last Year: Not on file  . Ran Out of Food in the Last Year: Not on file  Transportation Needs:   . Lack of Transportation (Medical): Not on file  . Lack of Transportation (Non-Medical): Not on file  Physical Activity:   . Days of Exercise per Week: Not on file  . Minutes  of Exercise per Session: Not on file  Stress:   . Feeling of Stress : Not on file  Social Connections:   . Frequency of Communication with Friends and Family: Not on file  . Frequency of Social Gatherings with Friends and Family: Not on file  . Attends Religious Services: Not on file  . Active Member of Clubs or Organizations: Not on file  . Attends Archivist Meetings: Not on file  . Marital Status: Not on file  Intimate Partner Violence:   . Fear of Current or Ex-Partner: Not on file  . Emotionally Abused: Not on file  . Physically Abused: Not on file  . Sexually Abused: Not on file      Family History  Problem Relation Age of Onset  . Diabetes Mother   . Diabetes Sister     Vitals:   01/30/20 1525  BP: 114/60  Pulse: 65  SpO2: 95%  Weight: 81.9 kg (180 lb 9.6 oz)  Height: 5' 10" (1.778 m)   PHYSICAL EXAM: General:  Well appearing elderly male, wearing supp O2 via [place]. No respiratory difficulty HEENT: normal Neck: supple. no JVD. Carotids 2+ bilat; no bruits. No lymphadenopathy or thyromegaly appreciated. Cor: PMI nondisplaced. Regular rate & rhythm. No rubs, gallops or murmurs. Lungs: clear Abdomen: soft, nontender, nondistended. No hepatosplenomegaly. No bruits or masses. Good bowel sounds. Extremities: no cyanosis, clubbing, rash, edema Neuro: alert & oriented x 3, cranial nerves grossly intact.  moves all 4 extremities w/o difficulty. Affect pleasant.    ASSESSMENT & PLAN:  1. Pulmonary HTN by echo 4/21 -RVSP 81.9 mmHg on echo 4/21. RV enlarged but systolic function normal  - suspect multifactorial - CT no evidence of PE (no VQ done) - PFTs reportedly with moderate severe obstructive airways disease. Suspect primary component of AH (WHO Group 3) - Has OSA but noncompliant with CPAP - RHC 7/21 mild to moderate PAH with normal left-sided pressures. Suspect primarily WHO Group 3 PAH but may have component of WHO Group 1. Recommendations were to manage hypoxia w/ improved compliance w/ home O2 and treatment w/ CPAP w/ plans to repeat echo after hypoxemia was better corrected. If still w/ evidence of RV strain, plan was for careful trial of PDE5i therapy.  - he has improved compliance w/ daytime Home O2 but still awaiting repeat sleep study for CPAP. 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 If unable to get study approved in the next several weeks, he will consider inpatient sleep study.  - once on CPAP therapy, plan to repeat 2D echo to reassess RVSP. If still significant RV strain, will plan careful trial of PDE5i therapy.   2. CAD s/p CABG - stable no evidence of ischemia - continue ASA. - previous recs were to consider addition of SGLT2i  however would avoid now that he has chronic foley for urinary retention   3. OSA - noncompliant with CPAP - last sleep study was > 10 years ago  - needs repeat study, awaiting insurance approval    He has close f/u w/ Dr. Agustin Cree. F/u w/ Dr. Haroldine Laws in 8 weeks   Lyda Jester, PA-C  3:40 PM

## 2020-01-30 NOTE — Patient Instructions (Addendum)
It was great to see you today! No medication changes are needed at this time.   Labs today We will only contact you if something comes back abnormal or we need to make some changes. Otherwise no news is good news!  Your physician has recommended that you have a sleep study. This test records several body functions during sleep, including: brain activity, eye movement, oxygen and carbon dioxide blood levels, heart rate and rhythm, breathing rate and rhythm, the flow of air through your mouth and nose, snoring, body muscle movements, and chest and belly movement.    Your physician recommends that you schedule a follow-up appointment in: December with Dr Haroldine Laws

## 2020-02-01 DIAGNOSIS — G4733 Obstructive sleep apnea (adult) (pediatric): Secondary | ICD-10-CM | POA: Diagnosis not present

## 2020-02-01 DIAGNOSIS — I2510 Atherosclerotic heart disease of native coronary artery without angina pectoris: Secondary | ICD-10-CM | POA: Diagnosis not present

## 2020-02-01 DIAGNOSIS — I10 Essential (primary) hypertension: Secondary | ICD-10-CM | POA: Diagnosis not present

## 2020-02-01 DIAGNOSIS — I270 Primary pulmonary hypertension: Secondary | ICD-10-CM | POA: Diagnosis not present

## 2020-02-06 ENCOUNTER — Telehealth (HOSPITAL_COMMUNITY): Payer: Self-pay | Admitting: *Deleted

## 2020-02-06 DIAGNOSIS — I272 Pulmonary hypertension, unspecified: Secondary | ICD-10-CM

## 2020-02-06 MED ORDER — POTASSIUM CHLORIDE ER 10 MEQ PO TBCR: 20.0000 meq | EXTENDED_RELEASE_TABLET | Freq: Two times a day (BID) | ORAL | 1 refills | Status: DC

## 2020-02-06 NOTE — Telephone Encounter (Signed)
Louis Houston, [place]  02/06/2020 5:07 PM EDT Back to Top    Pt and caregiver aware. Lab ordered mailed to pts home. Pt will have labs drawn at [facility] and faxed to the clinic.    Jolaine Artist, MD  02/04/2020 4:34 PM EDT     Add kcl 20 daily. Recheck bmet 2 weeks

## 2020-02-06 NOTE — Telephone Encounter (Signed)
-----  Message from Jolaine Artist, MD sent at 02/04/2020  4:34 PM EDT ----- Add kcl 20 daily.  Recheck bmet 2 weeks

## 2020-02-13 ENCOUNTER — Other Ambulatory Visit: Payer: Self-pay

## 2020-02-13 ENCOUNTER — Encounter: Payer: Self-pay | Admitting: Primary Care

## 2020-02-13 ENCOUNTER — Ambulatory Visit: Payer: Medicare Other | Admitting: Primary Care

## 2020-02-13 VITALS — BP 110/60 | HR 64 | Temp 97.3°F | Ht 70.0 in | Wt 178.8 lb

## 2020-02-13 DIAGNOSIS — J432 Centrilobular emphysema: Secondary | ICD-10-CM | POA: Diagnosis not present

## 2020-02-13 DIAGNOSIS — I272 Pulmonary hypertension, unspecified: Secondary | ICD-10-CM | POA: Diagnosis not present

## 2020-02-13 DIAGNOSIS — J849 Interstitial pulmonary disease, unspecified: Secondary | ICD-10-CM | POA: Diagnosis not present

## 2020-02-13 DIAGNOSIS — J9611 Chronic respiratory failure with hypoxia: Secondary | ICD-10-CM | POA: Diagnosis not present

## 2020-02-13 DIAGNOSIS — G4733 Obstructive sleep apnea (adult) (pediatric): Secondary | ICD-10-CM

## 2020-02-13 NOTE — Assessment & Plan Note (Signed)
-  Awaiting insurance approval for repeat sleep study with cardiology

## 2020-02-13 NOTE — Assessment & Plan Note (Signed)
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

## 2020-02-13 NOTE — Assessment & Plan Note (Signed)
-  Patient reports impaired sleep while on LAMA, this has improved since switching to ICS/LABA - Continues Breo 100 1 puff daily (rinse mouth after use)

## 2020-02-13 NOTE — Patient Instructions (Addendum)
Pleasure seeing you today Louis Houston  Recommendations - Stay off Spiriva; Continue Breo 1 puff daily (rinse mouth after use) - Continue 2 L oxygen on exertion and at night - I have sent a message over to cardiology to check on home sleep test, if you have not heard from them in the next 2 weeks please notify their office and/or ours -Likely will recheck echocardiogram after being on oxygen consistently for 1 to 2 months  Referral - Rheumatology re: positive serology/ILD  Follow-up: - 8 weeks with Louis Houston (ILD clinic)

## 2020-02-13 NOTE — Progress Notes (Signed)
_0  ID: Louis Houston, male    DOB: 1931-06-30, 86 y.o.   MRN: [id number]  Chief Complaint  Patient presents with  . Follow-up    Pt states he has been doing okay since last visit. Pt is using Inogen O2 and since receiving that, breathing has been some better.  Pt does have an occ cough.    Referring provider: Nicoletta Dress, MD  HPI: 86 year old male, former smoker.  Past medical history significant for obstructive sleep apnea, PAH (WHO group 3), CAD s/p CABG, dyspnea on exertion.  Patient of Dr. Chase Caller, seen for initial consult on 01/16/2020.  Previous low-power pulmonary encounter 01/16/2020-Consult MALAKAI SCHOENHERR 86 y.o. -retired former Sales promotion account executive.  Non-smoker.  Brought in by his daughter.  History is given by him reviewed the chart and talking to the daughter.  It appears patient was quite active up until few years ago.  He had bypass surgery in his early 23s.  His wife died a few years ago and after that he had overall decline in health status when he started using the cane.  Simultaneously the last few years has had insidious onset of shortness of breath but been progressively getting worse.  But he became aware only in the last few months to several months about hypoxemia at night and has been on nighttime oxygen.  Daughter also reports that he easily desaturates when he exerts himself.  They really want a portable oxygen system but it has been very difficult to get 1 that is light.  They are looking at the [facility] system but they are in need of a prescription for this.  He and his daughter also report that a few months ago [?  May 2021] he was started on Spiriva.  Then approximately few weeks later started having bladder issues including hematuria.  [facility] and has been told that his bladder muscle wall is weak and is on a permanent Foley catheter.  However per the history it appears that the bladder issues started after starting Spiriva.   It appears that Spiriva might be helping with the shortness of breath.  Overall with all this he is also gotten progressively more deconditioned.  At this point in time he lives alone with his dog but at night his children take turns sleeping in his house and checking on him once in the evening.  He had echocardiogram that suggested pulmonary hypertension.  He then saw Dr. Haroldine Laws who did a right heart catheterization which was done at the end of July 2021.  He is deemed to have group 3 PAH with a normal wedge pressure and a PVR greater than 7 and a PAP mean of 39.   He had a high-resolution CT chest that shows emphysema associated with possible mild ILD with pleural plaques suggestive of asbestos exposure.  He reports significant asbestos exposure during his work years   Your pulmonary function test at [facility] shows a mixture of restriction/obstruction.  FEV1 1.43 L / 54%.  FVC 2.15 L / 56% with a ratio of 66.  Do not have lung volumes or DLCO.  He seems to have chronic kidney disease and anemia  Of note he has a chronic Foley catheter last week he was admitted for bacterial infection   Results for LAWERANCE, MATSUO (MRN [id number]) as of 01/16/2020 11:01  Ref. Range 11/09/2019 02:01 11/23/2019 12:24 11/25/2019 14:35 11/25/2019 14:36  Hemoglobin Latest Ref Range: 13.0 - 17.0 g/dL 12.7 (L)  11.7 (L) 9.5 (L) 9.9 (L)     Results for ALEEM, ELZA (MRN [id number]) as of 01/16/2020 11:01  Ref. Range 09/06/2019 11:48 11/09/2019 02:01 11/23/2019 12:23 11/25/2019 14:35 11/25/2019 14:36  Creatinine Latest Ref Range: 0.61 - 1.24 mg/dL 1.17 1.30 (H) 1.31 (H)     SYMPTOM SCALE - ILD 01/16/2020   O2 use 2L [place] only  Shortness of Breath 0 -> 5 scale with 5 being worst (score 6 If unable to do)  At rest x  Simple tasks - showers, clothes change, eating, shaving x  Household (dishes, doing bed, laundry) x  Shopping xx  Walking level at own pace x  Walking up Stairs x  Total (30-36) Dyspnea Score x    How bad is your cough? x  How bad is your fatigue x  How bad is nausea x  How bad is vomiting?  x  How bad is diarrhea? x  How bad is anxiety? x  How bad is depression x    Simple office walk 185 feet x  3 laps goal with forehead probe 01/16/2020   O2 used ra  Number laps completed 1 of 3 only and then desaturated  Comments about pace Slow with cane  Resting Pulse Ox/HR 97% and 68/min  Final Pulse Ox/HR 88% and 65/min  Desaturated </= 88% yes  Desaturated <= 3% points Yes, 9 points  Got Tachycardic >/= 90/min 9  Symptoms at end of test no  Miscellaneous comments Dyspneic, needed 2L [place] to correct      02/13/2020-interim history Patient presents today for 1 month follow-up for Pulmonary HTN and chronic respiratory failure. During last visit he was started on BREO and recommended to discuss whether or not Spiriva was contributing to bladder issues.  Patient was started on Innogen portable oxygen concentrator at 2 L nasal cannula.  Lab work for [employer], rheumatoid factor, SCL-70, SSA, ssb and CCP checked.   He reports doing some better on Breo. He is off Spiriva, daughter has noticed a significant improvement in his sleeping at night since being off of this. Occasional cough.  He has been compliant with Innogen and is wearing 2L today in office. He wears oxygen on exertion and at night. He is established with [employer]. ANA was positive and titer was 1:320. CCP <16. RF <14. He has a hx of psoriasis and lupus does run in his family. He saw cardiology 1 week ago. BMET checked 01/30/20, 20MEQ KCL added daily. They re-ordered sleep study but have not heard back to schedule sleep study. Per their note they are awaiting insurance approval.      Allergies  Allergen Reactions  . Flomax [Tamsulosin] Other (See Comments)    Drop blood pressure    Immunization History  Administered Date(s) Administered  . Influenza-Unspecified 03/05/2018, 01/11/2019  . PFIZER SARS-COV-2 Vaccination 05/11/2019,  06/11/2019, 02/09/2020    History reviewed. No pertinent past medical history.  Tobacco History: Social History   Tobacco Use  Smoking Status Former Smoker  Smokeless Tobacco Never Used   Counseling given: Not Answered   Outpatient Medications Prior to Visit  Medication Sig Dispense Refill  . amLODipine (NORVASC) 10 MG tablet Take 5 mg by mouth daily.     Marland Kitchen aspirin 81 MG EC tablet Take 81 mg by mouth daily.  12  . fluticasone furoate-vilanterol (BREO ELLIPTA) 100-25 MCG/INH AEPB Inhale 1 puff into the lungs daily. 28 each 3  . furosemide (LASIX) 40 MG tablet TAKE ONE TABLET BY MOUTH TWICE  DAILY 60 tablet 3  . glipiZIDE (GLUCOTROL) 5 MG tablet Take 5 mg by mouth 2 (two) times daily.     . hydrALAZINE (APRESOLINE) 25 MG tablet Take 25 mg by mouth 2 (two) times daily.   12  . lisinopril (PRINIVIL,ZESTRIL) 40 MG tablet Take 40 mg by mouth at bedtime.     . metFORMIN (GLUCOPHAGE) 500 MG tablet Take 500 mg by mouth daily.     . metoprolol tartrate (LOPRESSOR) 50 MG tablet Take 50 mg by mouth 2 (two) times daily.     . Multiple Vitamin (MULTI-VITAMINS) TABS Take 1 tablet by mouth daily.    . potassium chloride (KLOR-CON) 10 MEQ tablet Take 2 tablets (20 mEq total) by mouth 2 (two) times daily. 120 tablet 1  . simvastatin (ZOCOR) 40 MG tablet Take 40 mg by mouth every evening.      No facility-administered medications prior to visit.    Review of Systems  Review of Systems  Constitutional: Negative.   Respiratory: Positive for cough. Negative for chest tightness, shortness of breath and wheezing.   Skin:       Psoriasis elbows  Psychiatric/Behavioral: Negative for sleep disturbance.    Physical Exam  BP 110/60 (BP Location: Left Arm, Cuff Size: Normal)   Pulse 64   Temp (!) 97.3 F (36.3 C) (Other (Comment)) Comment (Src): wrist  Ht _0  (1.778 m)   Wt 178 lb 12.8 oz (81.1 kg)   SpO2 94%   BMI 25.66 kg/m  Physical Exam Constitutional:      Appearance: Normal  appearance.  HENT:     Head: Normocephalic and atraumatic.     Right Ear: Tympanic membrane normal.     Left Ear: Tympanic membrane normal.     Mouth/Throat:     Mouth: Mucous membranes are moist.     Pharynx: Oropharynx is clear.  Cardiovascular:     Rate and Rhythm: Normal rate and regular rhythm.  Pulmonary:     Comments: Scant rales bases;  2L POC Skin:    General: Skin is warm and dry.  Neurological:     General: No focal deficit present.     Mental Status: He is alert and oriented to person, place, and time. Mental status is at baseline.  Psychiatric:        Mood and Affect: Mood normal.        Behavior: Behavior normal.        Thought Content: Thought content normal.        Judgment: Judgment normal.      Lab Results:  CBC    Component Value Date/Time   WBC 12.3 (H) 01/16/2020 1303   RBC 4.01 (L) 01/16/2020 1303   HGB 12.3 (L) 01/16/2020 1303   HCT 36.4 (L) 01/16/2020 1303   PLT 385.0 01/16/2020 1303   MCV 90.7 01/16/2020 1303   MCH 32.5 11/23/2019 1224   MCHC 33.9 01/16/2020 1303   RDW 14.4 01/16/2020 1303    BMET    Component Value Date/Time   NA 138 01/30/2020 1612   NA 141 09/06/2019 1148   K 3.6 01/30/2020 1612   CL 97 (L) 01/30/2020 1612   CO2 29 01/30/2020 1612   GLUCOSE 130 (H) 01/30/2020 1612   BUN 24 (H) 01/30/2020 1612   BUN 27 09/06/2019 1148   CREATININE 1.41 (H) 01/30/2020 1612   CALCIUM 9.3 01/30/2020 1612   GFRNONAA 45 (L) 01/30/2020 1612   GFRAA 52 (L) 01/30/2020 1612  BNP No results found for: BNP  ProBNP    Component Value Date/Time   PROBNP 2,176 (H) 09/06/2019 1147    Imaging: No results found.   Assessment & Plan:   Pulmonary hypertension ([place]) - 51m HG by echo in 2017 - Once hypoxemia is address will repeat echocardiogram, if still RV strain can consider careful trial PDE-5  Chronic respiratory failure with hypoxia ([place] - Qualified for INNOGEN at last visit, he is consistently wearing oxygen on  exertion/nocturnal  - Continue to ensure compliance with O2  Centrilobular emphysema ([place] - Patient reports impaired sleep while on LAMA, this has improved since switching to ICS/LABA - Continues Breo 100 1 puff daily (rinse mouth after use)   Obstructive sleep apnea - Awaiting insurance approval for repeat sleep study with cardiology  ILD (interstitial lung disease) ([place] - HRCT showed 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

## 2020-02-13 NOTE — Assessment & Plan Note (Addendum)
-  Qualified for INNOGEN at last visit, he is consistently wearing oxygen on exertion/nocturnal  - Continue to ensure compliance with O2

## 2020-02-13 NOTE — Assessment & Plan Note (Addendum)
-  44m HG by echo in 2017 - Once hypoxemia is address will repeat echocardiogram, if still RV strain can consider careful trial PDE-5

## 2020-02-17 DIAGNOSIS — R338 Other retention of urine: Secondary | ICD-10-CM | POA: Diagnosis not present

## 2020-02-22 ENCOUNTER — Other Ambulatory Visit: Payer: Self-pay | Admitting: Internal Medicine

## 2020-02-22 DIAGNOSIS — I272 Pulmonary hypertension, unspecified: Secondary | ICD-10-CM | POA: Diagnosis not present

## 2020-02-23 LAB — BASIC METABOLIC PANEL
BUN/Creatinine Ratio: 16 (ref 10–24)
BUN: 23 mg/dL (ref 8–27)
CO2: 25 mmol/L (ref 20–29)
Calcium: 9.5 mg/dL (ref 8.6–10.2)
Chloride: 96 mmol/L (ref 96–106)
Creatinine, Ser: 1.44 mg/dL — ABNORMAL HIGH (ref 0.76–1.27)
GFR calc Af Amer: 50 mL/min/{1.73_m2} — ABNORMAL LOW (ref >59)
GFR calc non Af Amer: 44 mL/min/{1.73_m2} — ABNORMAL LOW (ref >59)
Glucose: 199 mg/dL — ABNORMAL HIGH (ref 65–99)
Potassium: 4.1 mmol/L (ref 3.5–5.2)
Sodium: 138 mmol/L (ref 134–144)

## 2020-03-02 DIAGNOSIS — G4733 Obstructive sleep apnea (adult) (pediatric): Secondary | ICD-10-CM | POA: Diagnosis not present

## 2020-03-02 DIAGNOSIS — I10 Essential (primary) hypertension: Secondary | ICD-10-CM | POA: Diagnosis not present

## 2020-03-02 DIAGNOSIS — I2510 Atherosclerotic heart disease of native coronary artery without angina pectoris: Secondary | ICD-10-CM | POA: Diagnosis not present

## 2020-03-02 DIAGNOSIS — I270 Primary pulmonary hypertension: Secondary | ICD-10-CM | POA: Diagnosis not present

## 2020-03-05 ENCOUNTER — Other Ambulatory Visit: Payer: Self-pay | Admitting: Cardiology

## 2020-03-09 ENCOUNTER — Ambulatory Visit: Payer: Medicare Other | Admitting: Cardiology

## 2020-03-09 ENCOUNTER — Encounter: Payer: Self-pay | Admitting: Cardiology

## 2020-03-09 ENCOUNTER — Other Ambulatory Visit: Payer: Self-pay

## 2020-03-09 VITALS — BP 120/66 | HR 58 | Ht 70.0 in | Wt 182.0 lb

## 2020-03-09 DIAGNOSIS — R0600 Dyspnea, unspecified: Secondary | ICD-10-CM

## 2020-03-09 DIAGNOSIS — Z951 Presence of aortocoronary bypass graft: Secondary | ICD-10-CM

## 2020-03-09 DIAGNOSIS — I272 Pulmonary hypertension, unspecified: Secondary | ICD-10-CM

## 2020-03-09 DIAGNOSIS — E785 Hyperlipidemia, unspecified: Secondary | ICD-10-CM

## 2020-03-09 DIAGNOSIS — I10 Essential (primary) hypertension: Secondary | ICD-10-CM | POA: Diagnosis not present

## 2020-03-09 DIAGNOSIS — I2510 Atherosclerotic heart disease of native coronary artery without angina pectoris: Secondary | ICD-10-CM

## 2020-03-09 DIAGNOSIS — R0609 Other forms of dyspnea: Secondary | ICD-10-CM

## 2020-03-09 DIAGNOSIS — J849 Interstitial pulmonary disease, unspecified: Secondary | ICD-10-CM

## 2020-03-09 NOTE — Patient Instructions (Signed)
Medication Instructions:  Your physician recommends that you continue on your current medications as directed. Please refer to the Current Medication list given to you today.  *If you need a refill on your cardiac medications before your next appointment, please call your pharmacy*   Lab Work: None. If you have labs (blood work) drawn today and your tests are completely normal, you will receive your results only by: Marland Kitchen MyChart Message (if you have MyChart) OR . A paper copy in the mail If you have any lab test that is abnormal or we need to change your treatment, we will call you to review the results.   Testing/Procedures: Your physician has recommended that you have a sleep study. This test records several body functions during sleep, including: brain activity, eye movement, oxygen and carbon dioxide blood levels, heart rate and rhythm, breathing rate and rhythm, the flow of air through your mouth and nose, snoring, body muscle movements, and chest and belly movement.      Follow-Up: At [facility], you and your health needs are our priority.  As part of our continuing mission to provide you with exceptional heart care, we have created designated Provider Care Teams.  These Care Teams include your primary Cardiologist (physician) and Advanced Practice Providers (APPs -  Physician Assistants and Nurse Practitioners) who all work together to provide you with the care you need, when you need it.  We recommend signing up for the patient portal called "MyChart".  Sign up information is provided on this After Visit Summary.  MyChart is used to connect with patients for Virtual Visits (Telemedicine).  Patients are able to view lab/test results, encounter notes, upcoming appointments, etc.  Non-urgent messages can be sent to your provider as well.   To learn more about what you can do with MyChart, go to [email or web address].    Your next appointment:   3 month(s)  The format for your  next appointment:   In Person  Provider:   Jenne Campus, MD   Other Instructions   Sleep Studies A sleep study (polysomnogram) is a series of tests done while you are sleeping. A sleep study records your brain waves, heart rate, breathing rate, oxygen level, and eye and leg movements. A sleep study helps your health care provider:  See how well you sleep.  Diagnose a sleep disorder.  Determine how severe your sleep disorder is.  Create a plan to treat your sleep disorder. Your health care provider may recommend a sleep study if you:  Feel sleepy on most days.  Snore loudly while sleeping.  Have unusual behaviors while you sleep, such as walking.  Have brief periods in which you stop breathing during sleep (sleepapnea).  Fall asleep suddenly during the day (narcolepsy).  Have trouble falling asleep or staying asleep (insomnia).  Feel like you need to move your legs when trying to fall asleep (restless legs syndrome).  Move your legs by flexing and extending them regularly while asleep (periodic limb movement disorder).  Act out your dreams while you sleep (sleep behavior disorder).  Feel like you cannot move when you first wake up (sleep paralysis). What tests are part of a sleep study? Most sleep studies record the following during sleep:  Brain activity.  Eye movements.  Heart rate and rhythm.  Breathing rate and rhythm.  Blood-oxygen level.  Blood pressure.  Chest and belly movement as you breathe.  Arm and leg movements.  Snoring or other noises.  Body position. Where are sleep  studies done? Sleep studies are done at sleep centers. A sleep center may be inside a hospital, office, or clinic. The room where you have the study may look like a hospital room or a hotel room. The health care providers doing the study may come in and out of the room during the study. Most of the time, they will be in another room monitoring your test as you  sleep. How are sleep studies done? Most sleep studies are done during a normal period of time for a full night of sleep. You will arrive at the study center in the evening and go home in the morning. Before the test  Bring your pajamas and toothbrush with you to the sleep study.  Do not have caffeine on the day of your sleep study.  Do not drink alcohol on the day of your sleep study.  Your health care provider will let you know if you should stop taking any of your regular medicines before the test. During the test      Round, sticky patches with sensors attached to recording wires (electrodes) are placed on your scalp, face, chest, and limbs.  Wires from all the electrodes and sensors run from your bed to a computer. The wires can be taken off and put back on if you need to get out of bed to go to the bathroom.  A sensor is placed over your nose to measure airflow.  A finger clip is put on your finger or ear to measure your blood oxygen level (pulse oximetry).  A belt is placed around your belly and a belt is placed around your chest to measure breathing movements.  If you have signs of the sleep disorder called sleep apnea during your test, you may get a treatment mask to wear for the second half of the night. ? The mask provides positive airway pressure (PAP) to help you breathe better during sleep. This may greatly improve your sleep apnea. ? You will then have all tests done again with the mask in place to see if your measurements and recordings change. After the test  A medical doctor who specializes in sleep will evaluate the results of your sleep study and share them with you and your primary health care provider.  Based on your results, your medical history, and a physical exam, you may be diagnosed with a sleep disorder, such as: ? Sleep apnea. ? Restless legs syndrome. ? Sleep-related behavior disorder. ? Sleep-related movement disorders. ? Sleep-related seizure  disorders.  Your health care team will help determine your treatment options based on your diagnosis. This may include: ? Improving your sleep habits (sleep hygiene). ? Wearing a continuous positive airway pressure (CPAP) or bi-level positive airway pressure (BPAP) mask. ? Wearing an oral device at night to improve breathing and reduce snoring. ? Taking medicines. Follow these instructions at home:  Take over-the-counter and prescription medicines only as told by your health care provider.  If you are instructed to use a CPAP or BPAP mask, make sure you use it nightly as directed.  Make any lifestyle changes that your health care provider recommends.  If you were given a device to open your airway while you sleep, use it only as told by your health care provider.  Do not use any tobacco products, such as cigarettes, chewing tobacco, and e-cigarettes. If you need help quitting, ask your health care provider.  Keep all follow-up visits as told by your health care provider. This is  important. Summary  A sleep study (polysomnogram) is a series of tests done while you are sleeping. It shows how well you sleep.  Most sleep studies are done over one full night of sleep. You will arrive at the study center in the evening and go home in the morning.  If you have signs of the sleep disorder called sleep apnea during your test, you may get a treatment mask to wear for the second half of the night.  A medical doctor who specializes in sleep will evaluate the results of your sleep study and share them with your primary health care provider. This information is not intended to replace advice given to you by your health care provider. Make sure you discuss any questions you have with your health care provider. Document Revised: 10/06/2018 Document Reviewed: 05/19/2017 Elsevier Patient Education  [place].

## 2020-03-09 NOTE — Addendum Note (Signed)
Addended by: Senaida Ores on: 03/09/2020 02:42 PM   Modules accepted: Orders

## 2020-03-09 NOTE — Progress Notes (Signed)
Cardiology Office Note:    Date:  03/09/2020   ID:  Louis Houston, DOB Oct 18, 1931, MRN [id number]  PCP:  Nicoletta Dress, MD  Cardiologist:  Jenne Campus, MD    Referring MD: Nicoletta Dress, MD   Chief Complaint  Patient presents with  . Follow-up  Am still short of breath  History of Present Illness:    Louis Houston is a 86 y.o. male with past medical history significant for coronary artery disease, status post coronary bypass graft years ago, essential hypertension, dyslipidemia, dyspnea on exertion, pulmonary hypertension.  He did have a right-sided cardiac catheterization done it looks like he does have pulmonary hypertension group 3 or group 1.  The first course of action is to improve his oxygenation.  He does wear oxygen on the regular basis 24/7, on top of that he is still awaiting sleep study and does some difficulty getting this done.  We will try to see if we can speed up the process also plan is if there is no improvement in his pulmonary pressure just with improved oxygenation then careful addition of Revatio will be considered.  Overall he still complaining of having shortness of breath.  However he is happy with his oxygen that helps him a little bit.  No chest pain tightness pressure or squeezing in the chest, but does have some swelling of lower extremities sometimes.  No past medical history on file.  Past Surgical History:  Procedure Laterality Date  . CARDIAC SURGERY    . GALLBLADDER SURGERY    . PROSTATE SURGERY    . RIGHT HEART CATH N/A 11/25/2019   Procedure: RIGHT HEART CATH;  Surgeon: Jolaine Artist, MD;  Location: [place] [facility];  Service: Cardiovascular;  Laterality: N/A;    Current Medications: Current Meds  Medication Sig  . amLODipine (NORVASC) 5 MG tablet Take 5 mg by mouth daily.  Marland Kitchen aspirin 81 MG EC tablet Take 81 mg by mouth daily.  . fluticasone furoate-vilanterol (BREO ELLIPTA) 100-25 MCG/INH AEPB Inhale 1 puff into the lungs  daily.  . furosemide (LASIX) 40 MG tablet TAKE ONE TABLET BY MOUTH TWICE DAILY  . glipiZIDE (GLUCOTROL) 5 MG tablet Take 5 mg by mouth 2 (two) times daily.   . hydrALAZINE (APRESOLINE) 25 MG tablet Take 25 mg by mouth 2 (two) times daily.   Marland Kitchen lisinopril (PRINIVIL,ZESTRIL) 40 MG tablet Take 40 mg by mouth at bedtime.   . metFORMIN (GLUCOPHAGE) 500 MG tablet Take 500 mg by mouth daily.   . metoprolol tartrate (LOPRESSOR) 50 MG tablet Take 50 mg by mouth 2 (two) times daily.   . Multiple Vitamin (MULTI-VITAMINS) TABS Take 1 tablet by mouth daily.  . potassium chloride (KLOR-CON) 10 MEQ tablet Take 2 tablets (20 mEq total) by mouth 2 (two) times daily.  . simvastatin (ZOCOR) 40 MG tablet Take 40 mg by mouth every evening.      Allergies:   Flomax [tamsulosin]   Social History   Socioeconomic History  . Marital status: Widowed    Spouse name: Not on file  . Number of children: Not on file  . Years of education: Not on file  . Highest education level: Not on file  Occupational History  . Not on file  Tobacco Use  . Smoking status: Former Research scientist (life sciences)  . Smokeless tobacco: Never Used  Substance and Sexual Activity  . Alcohol use: Not Currently  . Drug use: Not on file  . Sexual activity: Not on  file  Other Topics Concern  . Not on file  Social History Narrative  . Not on file   Social Determinants of Health   Financial Resource Strain:   . Difficulty of Paying Living Expenses: Not on file  Food Insecurity:   . Worried About Charity fundraiser in the Last Year: Not on file  . Ran Out of Food in the Last Year: Not on file  Transportation Needs:   . Lack of Transportation (Medical): Not on file  . Lack of Transportation (Non-Medical): Not on file  Physical Activity:   . Days of Exercise per Week: Not on file  . Minutes of Exercise per Session: Not on file  Stress:   . Feeling of Stress : Not on file  Social Connections:   . Frequency of Communication with Friends and Family: Not  on file  . Frequency of Social Gatherings with Friends and Family: Not on file  . Attends Religious Services: Not on file  . Active Member of Clubs or Organizations: Not on file  . Attends Archivist Meetings: Not on file  . Marital Status: Not on file     Family History: The patient's family history includes Diabetes in his mother and sister. ROS:   Please see the history of present illness.    All 14 point review of systems negative except as described per history of present illness  EKGs/Labs/Other Studies Reviewed:      Recent Labs: 09/06/2019: NT-Pro BNP 2,176 01/16/2020: Hemoglobin 12.3; Platelets 385.0 02/22/2020: BUN 23; Creatinine, Ser 1.44; Potassium 4.1; Sodium 138  Recent Lipid Panel No results found for: CHOL, TRIG, HDL, CHOLHDL, VLDL, LDLCALC, LDLDIRECT  Physical Exam:    VS:  BP 120/66 (BP Location: Right Arm, Patient Position: Sitting, Cuff Size: Normal)   Pulse (!) 58   Ht _0  (1.778 m)   Wt 182 lb (82.6 kg)   SpO2 96%   BMI 26.11 kg/m     Wt Readings from Last 3 Encounters:  03/09/20 182 lb (82.6 kg)  02/13/20 178 lb 12.8 oz (81.1 kg)  01/30/20 180 lb 9.6 oz (81.9 kg)     GEN:  Well nourished, well developed in no acute distress HEENT: Normal NECK: No JVD; No carotid bruits LYMPHATICS: No lymphadenopathy CARDIAC: RRR, no murmurs, no rubs, no gallops RESPIRATORY:  Clear to auscultation without rales, wheezing or rhonchi  ABDOMEN: Soft, non-tender, non-distended MUSCULOSKELETAL:  No edema; No deformity  SKIN: Warm and dry LOWER EXTREMITIES: no swelling NEUROLOGIC:  Alert and oriented x 3 PSYCHIATRIC:  Normal affect   ASSESSMENT:    1. Pulmonary hypertension (HCC)   2. Coronary artery disease involving native coronary artery of native heart without angina pectoris   3. Essential hypertension   4. ILD (interstitial lung disease) ([place])   5. Status post coronary artery bypass graft   6. Dyslipidemia   7. Dyspnea on exertion     PLAN:    In order of problems listed above:  1. Pulmonary hypertension managed by pulmonologist as well as pulmonary hypertension clinic.  Plan as outlined above.  We will try to push quicker sleep study. 2. Coronary disease stable from that point review on appropriate medications which I will continue. 3. Essential hypertension: Blood pressure well controlled. 4. Dyslipidemia: I did review his K PN October 24, 2019 showed LDL of 42 HDL 36 this is on simvastatin 40 which I will continue. 5. Dyspnea on exertion multifactorial but pulmonary hypertension played most significantly while  here.  I did review records by pulmonologist as well as pulmonary hypertension clinic for this visit.   Medication Adjustments/Labs and Tests Ordered: Current medicines are reviewed at length with the patient today.  Concerns regarding medicines are outlined above.  No orders of the defined types were placed in this encounter.  Medication changes: No orders of the defined types were placed in this encounter.   Signed, Park Liter, MD, [facility] 03/09/2020 2:35 PM    [place]

## 2020-03-16 ENCOUNTER — Telehealth: Payer: Self-pay | Admitting: Cardiology

## 2020-03-16 DIAGNOSIS — R338 Other retention of urine: Secondary | ICD-10-CM | POA: Diagnosis not present

## 2020-03-16 NOTE — Telephone Encounter (Signed)
No PA required for split night study, pt scheduled for 04/23/20 at 8pm.

## 2020-03-20 ENCOUNTER — Ambulatory Visit: Payer: Medicare Other | Admitting: Internal Medicine

## 2020-03-20 ENCOUNTER — Encounter: Payer: Self-pay | Admitting: Internal Medicine

## 2020-03-20 ENCOUNTER — Other Ambulatory Visit: Payer: Self-pay

## 2020-03-20 VITALS — BP 81/47 | HR 70 | Ht 70.0 in | Wt 180.0 lb

## 2020-03-20 DIAGNOSIS — I272 Pulmonary hypertension, unspecified: Secondary | ICD-10-CM

## 2020-03-20 DIAGNOSIS — L400 Psoriasis vulgaris: Secondary | ICD-10-CM

## 2020-03-20 DIAGNOSIS — J849 Interstitial pulmonary disease, unspecified: Secondary | ICD-10-CM | POA: Diagnosis not present

## 2020-03-20 DIAGNOSIS — R768 Other specified abnormal immunological findings in serum: Secondary | ICD-10-CM

## 2020-03-20 DIAGNOSIS — I959 Hypotension, unspecified: Secondary | ICD-10-CM | POA: Insufficient documentation

## 2020-03-20 HISTORY — DX: Hypotension, unspecified: I95.9

## 2020-03-20 HISTORY — DX: Psoriasis vulgaris: L40.0

## 2020-03-20 HISTORY — DX: Other specified abnormal immunological findings in serum: R76.8

## 2020-03-20 NOTE — Progress Notes (Addendum)
Office Visit Note  Patient: Louis Houston             Date of Birth: 02/14/1932           MRN: [id number]             PCP: Nicoletta Dress, MD Referring: Martyn Ehrich, NP Visit Date: 03/20/2020   Subjective:  New Patient (Initial Visit) and Psoriasis   History of Present Illness: Louis Houston is a 86 y.o. male with a history of coronary artery disease, hypertension, chronic hypoxic respiratory failure, emphysema, pulmonary hypertension, psoriasis and interstitial lung disease here for evaluation of positive ANA test checked during evaluation of his pulmonary disease with high resolution chest CT showing lower lobe ILD changes that may be new compared to before this year.  His shortness of breath problems did not start bothering him a lot until the past 2 to 3 years.  Before then he was able to take walks without having significant dyspnea but now he requires supplemental oxygen with minimal walking even just indoors.  Besides the worsening dyspnea he has not noticed a lot of other symptom complaints changed in this time.  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 He has a family history of lupus diagnosed for his niece and his granddaughter but no personal history of this.  He does have a long history of plaque psoriasis he recalls several treatments for this years ago including topical medicines and what sounds like methotrexate that he did not tolerate due to GI symptoms at some point.  He does not recall ever being diagnosed with psoriatic arthritis associated with his plaque psoriasis.  Currently he denies any new increase symptoms of dry eyes or mouth, alopecia, lymphadenopathy, pleurisy, skin rashes, blood clots, or Raynaud's phenomenon.  He denies a history of inflammatory eye disease or inflammatory bowel disease.  He has had rectal bleeding in the past  attributed to internal hemorrhoids and earlier this year was seen for blood in his stools attributed to NSAID use.  His daughter is present in clinic today and provides additional collateral history.   Labs reviewed 01/2020 ANA 1:320 nuclear, DFS RF negative CCP negative  Imaging reviewed 12/2019 CT Chest  Emphysema and pleural plaques, also "suggestion of mild subpleural reticulation and ground-glass opacity at the lung bases, poorly evaluated due to motion degradation, not definitely changed since 09/06/2019. No definite regions of traction bronchiectasis or frank honeycombing."  09/2019 CT chest Outside report reviewed showing "mild centrilobular and paraseptal emphysema.  Small amounts of secretions at the carina extending into the left mainstem bronchus.  No focal consolidation, pleural effusion, or pneumothorax.  Chronic mild posterior pleural thickening along the left greater than right lower lobes, similar to prior study.  Chronic subsegmental scarring in the left lower lobe.  Unchanged 5 mm nodule in the left upper lobe.  Unchanged 2 mm nodule in the left lower lobe.  No new pulmonary nodule."   Activities of Daily Living:  Patient reports morning stiffness for 0 minutes.   Patient Denies nocturnal pain.  Difficulty dressing/grooming: Denies Difficulty climbing stairs: Denies Difficulty getting out of chair: Denies Difficulty using hands for taps, buttons, cutlery, and/or writing: Reports  Review of Systems  Constitutional: Positive for fatigue.  HENT: Negative for mouth sores, mouth dryness and nose dryness.   Eyes: Positive for itching. Negative for pain, visual disturbance and dryness.  Respiratory: Positive for cough and shortness of breath. Negative for hemoptysis and difficulty breathing.   Cardiovascular: Positive for swelling in legs/feet. Negative for chest pain and palpitations.  Gastrointestinal: Negative for abdominal pain, blood in stool, constipation and  diarrhea.  Endocrine: Negative for increased urination.  Genitourinary: Negative for painful urination.  Musculoskeletal: Negative for arthralgias, joint pain, joint swelling, myalgias, muscle weakness, morning stiffness, muscle tenderness and myalgias.  Skin: Positive for rash and redness. Negative for color change.  Allergic/Immunologic: Negative for susceptible to infections.  Neurological: Negative for dizziness, numbness, headaches and memory loss.  Hematological: Negative for swollen glands.  Psychiatric/Behavioral: Negative for confusion and sleep disturbance.    PMFS History:  Patient Active Problem List   Diagnosis Date Noted  . Positive ANA (antinuclear antibody) 03/20/2020  . Plaque psoriasis 03/20/2020  . Hypotension 03/20/2020  . Chronic respiratory failure with hypoxia ([place]) 02/13/2020  . Centrilobular emphysema ([place]) 02/13/2020  . ILD (interstitial lung disease) ([place]) 02/13/2020  . Dyspnea on exertion 08/26/2019  . Pulmonary hypertension ([place]) 11/05/2015  . Coronary artery disease involving native coronary artery of native heart without angina pectoris 06/14/2015  . Dyslipidemia 06/14/2015  . Essential hypertension 06/14/2015  . Obstructive sleep apnea 06/14/2015  . Status post coronary artery bypass graft 06/14/2015    Past Medical History:  Diagnosis Date  . Centrilobular emphysema ([place])   . Chronic respiratory failure with hypoxia ([place])   . Coronary artery disease involving native coronary artery of native heart without angina pectoris   . Dyslipidemia   . Dyspnea on exertion   . Essential hypertension   . ILD (interstitial lung disease) ([place])   . Obstructive sleep apnea   . Psoriasis   . Pulmonary hypertension ([place])   . Status post coronary artery bypass graft   . Type 2 diabetes mellitus (HCC)     Family History  Problem Relation Age of Onset  . Diabetes Mother   . Heart disease Mother   . Stroke Father   . Diabetes Daughter   . Lupus Daughter   .  Hypertension Daughter   . Lupus Niece   . Diabetes Daughter   . Ulcers Daughter        Stomach Ulcers  . Hypertension Daughter   . Osteoarthritis Daughter   . Psoriasis Daughter    Past Surgical History:  Procedure Laterality Date  . CARDIAC SURGERY    . GALLBLADDER SURGERY    . PROSTATE SURGERY    . RIGHT HEART CATH N/A 11/25/2019   Procedure: RIGHT HEART CATH;  Surgeon: Jolaine Artist, MD;  Location: [facility];  Service: Cardiovascular;  Laterality: N/A;   Social History   Social History Narrative  . Not on file   Immunization History  Administered Date(s) Administered  . Influenza-Unspecified 03/05/2018, 01/11/2019  . PFIZER SARS-COV-2 Vaccination 05/11/2019, 06/11/2019, 02/09/2020     Objective: Vital Signs: BP (!) 81/47 (BP Location: Left Arm, Patient Position: Sitting, Cuff Size: Small)   Pulse 70   Ht _0  (1.778 m)   Wt 180 lb (81.6 kg)   BMI 25.83 kg/m    Head: No alopecia or scalp rashes Mouth: Moist oral mucosa, no lesions Neck: No lymphadenopathy Cardiac: Regular rate and rhythm Pulmonary: On portable supplemental oxygen by nasal cannula, normal work of breathing, Bibasilar inspiratory crackles Skin: large plaque on right elbow small plaque on right forearm, small scattered plaques over left elbow Hyperpigmented patches on distal legs and dorsum of feet, well healed scar over  saphenous vein on right No nailfold capillary changes on exam, no nail change or pitting  Musculoskeletal Exam:  Neck full range of motion no tenderness Shoulder full range of motion no tenderness or swelling Elbow extension ROM slightly reduced no tenderness or swelling Wrist extension ROM slightly reduced no tenderness of swelling Few bony nodules on fingers no swelling or warmth, able to tightly close fists Knees full ROM some tenderness on medial aspect on left no swelling Ankles, MTPs full range of motion no tenderness or swelling   Investigation: No  additional findings.  Imaging: No results found.  Recent Labs: Lab Results  Component Value Date   WBC 12.3 (H) 01/16/2020   HGB 12.3 (L) 01/16/2020   PLT 385.0 01/16/2020   NA 138 02/22/2020   K 4.1 02/22/2020   CL 96 02/22/2020   CO2 25 02/22/2020   GLUCOSE 199 (H) 02/22/2020   BUN 23 02/22/2020   CREATININE 1.44 (H) 02/22/2020   CALCIUM 9.5 02/22/2020   GFRAA 50 (L) 02/22/2020    Speciality Comments: No specialty comments available.  Procedures:  No procedures performed Allergies: Flomax [tamsulosin]   Assessment / Plan:     Visit Diagnoses: ILD (interstitial lung disease) ([place]) Positive ANA (antinuclear antibody) Pulmonary hypertension  Review of medical history imaging and labs and physical exam today did not demonstrate evidence of autoimmune disease.  No keratoconjunctivitis sicca and moist oral mucosa. No raynaud's phenomenon nailfold capillary changes or skin thickening. He has plaque psoriasis but no other skin rashes no mucocutaneous or inflammatory arthritis signs of lupus.  He did have significantly positive ANA 1:320 titer but immunofluorescence pattern of dense fine stranded is suggestive against a primary autoimmune disorder. This is more likely to be seen alongside alternative causes of inflammation so is very nonspecific. No additional rheumatology testing or follow up recommended at this time.  Plaque psoriasis  Currently skin disease is limited to elbows and right forearm. No inflammatory arthritis on exam. No history of ocular or gastrointestinal disease suggesting for spondyloarthritis history. Symptoms are likely a bit worse due to colder weather with staying indoors and decreased UV exposure. Can continue topical treatments no evidence of systemic involvement.  Hypotension  Blood pressure is low 81/47 in clinic today without symptom complaints. His daughter does provide that he has intermittent dizziness sensation. No signs of hypoperfusion on exam. He  is recommended to follow up with his primary doctor to evaluate this, and he says an appointment is scheduled for tomorrow.  Orders: No orders of the defined types were placed in this encounter.  No orders of the defined types were placed in this encounter.  Follow-Up Instructions: No follow-ups on file.   Collier Salina, MD  Note - This record has been created using [employer].  Chart creation errors have been sought, but may not always  have been located. Such creation errors do not reflect on  the standard of medical care.

## 2020-03-20 NOTE — Patient Instructions (Signed)
I do see psoriasis currently active over the right elbow and right forearm and left elbow no evidence of joint inflammation.  No evidence of systemic disease involvement.  The ANA test can be nonspecific and I do not see any evidence of scleroderma, lupus, or Sjogren's syndrome as a problem underlying your lung disease.  I do not recommend specific additional rheumatology testing or any treatment at this time.  I do recommend following up with your primary care clinic regarding your low blood pressure today.

## 2020-03-21 DIAGNOSIS — Z79899 Other long term (current) drug therapy: Secondary | ICD-10-CM | POA: Diagnosis not present

## 2020-03-21 DIAGNOSIS — I2510 Atherosclerotic heart disease of native coronary artery without angina pectoris: Secondary | ICD-10-CM | POA: Diagnosis not present

## 2020-03-21 DIAGNOSIS — E1169 Type 2 diabetes mellitus with other specified complication: Secondary | ICD-10-CM | POA: Diagnosis not present

## 2020-03-21 DIAGNOSIS — E785 Hyperlipidemia, unspecified: Secondary | ICD-10-CM | POA: Diagnosis not present

## 2020-03-21 DIAGNOSIS — Z23 Encounter for immunization: Secondary | ICD-10-CM | POA: Diagnosis not present

## 2020-03-21 DIAGNOSIS — R8271 Bacteriuria: Secondary | ICD-10-CM | POA: Diagnosis not present

## 2020-03-21 DIAGNOSIS — I10 Essential (primary) hypertension: Secondary | ICD-10-CM | POA: Diagnosis not present

## 2020-03-28 ENCOUNTER — Other Ambulatory Visit (HOSPITAL_COMMUNITY): Payer: Self-pay | Admitting: Internal Medicine

## 2020-03-29 NOTE — Progress Notes (Signed)
Seeing him 04/03/20. THesr rsults reviewed by both rheumatology and APP

## 2020-04-02 DIAGNOSIS — I10 Essential (primary) hypertension: Secondary | ICD-10-CM | POA: Diagnosis not present

## 2020-04-02 DIAGNOSIS — I2510 Atherosclerotic heart disease of native coronary artery without angina pectoris: Secondary | ICD-10-CM | POA: Diagnosis not present

## 2020-04-02 DIAGNOSIS — I270 Primary pulmonary hypertension: Secondary | ICD-10-CM | POA: Diagnosis not present

## 2020-04-02 DIAGNOSIS — G4733 Obstructive sleep apnea (adult) (pediatric): Secondary | ICD-10-CM | POA: Diagnosis not present

## 2020-04-03 ENCOUNTER — Ambulatory Visit: Payer: Medicare Other | Admitting: Internal Medicine

## 2020-04-03 ENCOUNTER — Encounter: Payer: Self-pay | Admitting: Internal Medicine

## 2020-04-03 ENCOUNTER — Other Ambulatory Visit: Payer: Self-pay

## 2020-04-03 VITALS — BP 112/72 | HR 74 | Temp 97.1°F | Ht 70.0 in | Wt 180.4 lb

## 2020-04-03 DIAGNOSIS — I272 Pulmonary hypertension, unspecified: Secondary | ICD-10-CM

## 2020-04-03 DIAGNOSIS — J920 Pleural plaque with presence of asbestos: Secondary | ICD-10-CM | POA: Diagnosis not present

## 2020-04-03 DIAGNOSIS — J432 Centrilobular emphysema: Secondary | ICD-10-CM | POA: Diagnosis not present

## 2020-04-03 DIAGNOSIS — R400 Somnolence: Secondary | ICD-10-CM

## 2020-04-03 DIAGNOSIS — J9611 Chronic respiratory failure with hypoxia: Secondary | ICD-10-CM

## 2020-04-03 DIAGNOSIS — Z8669 Personal history of other diseases of the nervous system and sense organs: Secondary | ICD-10-CM

## 2020-04-03 DIAGNOSIS — J849 Interstitial pulmonary disease, unspecified: Secondary | ICD-10-CM

## 2020-04-03 NOTE — Patient Instructions (Addendum)
Chronic respiratory failure with hypoxia (HCC) Centrilobular emphysema (HCC)  -Currently stable disease  Plan -Continue Breo scheduled -Continue oxygen with exertion and at night and as needed during rest with goal pulse ox greater than 88% -Hold off on Spiriva due to insomnia  Pulmonary hypertension ([place])  -Per Dr. 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  -You might or might not have interstitial lung disease associated with asbestos exposure  Plan -Repeat high-resolution CT chest in August 2022   History of sleep apnea Daytime somnolence  Given prior history of sleep apnea and current ongoing pulmonary issues I agree we need to establish if he has sleep apnea or not at this point in time  Supportive concern of insomnia if he were to do the sleep study at [facility] later in December 2021  Plan - make referral for home sleep study due to inability to do at [facility] long due to age and fear of insomia -Refer sleep specialist Dr. Golden Hurter or any of the sleep specialist within pulmonary -however first available   Follow-up -6 months or sooner if needed

## 2020-04-03 NOTE — Progress Notes (Signed)
OV 01/16/2020  Subjective:  Patient ID: Louis Houston, male , DOB: [birth year] , age 86 y.o. , MRN: [id number] , ADDRESS: [address] [place] [address]   01/16/2020 -   Chief Complaint  Patient presents with  . Consult    pt has sob doing exertion.pt has chf     HPI Louis Houston 86 y.o. -retired former Sales promotion account executive.  Non-smoker.  Brought in by his daughter.  History is given by him reviewed the chart and talking to the daughter.  It appears patient was quite active up until few years ago.  He had bypass surgery in his early 54s.  His wife died a few years ago and after that he had overall decline in health status when he started using the cane.  Simultaneously the last few years has had insidious onset of shortness of breath but been progressively getting worse.  But he became aware only in the last few months to several months about hypoxemia at night and has been on nighttime oxygen.  Daughter also reports that he easily desaturates when he exerts himself.  They really want a portable oxygen system but it has been very difficult to get 1 that is light.  They are looking at the [facility] system but they are in need of a prescription for this.  He and his daughter also report that a few months ago [?  May 2021] he was started on Spiriva.  Then approximately few weeks later started having bladder issues including hematuria.  [facility] and has been told that his bladder muscle wall is weak and is on a permanent Foley catheter.  However per the history it appears that the bladder issues started after starting Spiriva.  It appears that Spiriva might be helping with the shortness of breath.  Overall with all this he is also gotten progressively more deconditioned.  At this point in time he lives alone with his dog but at night his children take turns sleeping in his house and checking on him once in the evening.  He had echocardiogram that suggested  pulmonary hypertension.  He then saw Dr. Haroldine Laws who did a right heart catheterization which was done at the end of July 2021.  He is deemed to have group 3 PAH with a normal wedge pressure and a PVR greater than 7 and a PAP mean of 39.   He had a high-resolution CT chest that shows emphysema associated with possible mild ILD with pleural plaques suggestive of asbestos exposure.  He reports significant asbestos exposure during his work years   Your pulmonary function test at [facility] shows a mixture of restriction/obstruction.  FEV1 1.43 L / 54%.  FVC 2.15 L / 56% with a ratio of 66.  Do not have lung volumes or DLCO.  He seems to have chronic kidney disease and anemia  Of note he has a chronic Foley catheter last week he was admitted for bacterial infection   Results for Louis, Houston (MRN [id number]) as of 01/16/2020 11:01  Ref. Range 11/09/2019 02:01 11/23/2019 12:24 11/25/2019 14:35 11/25/2019 14:36  Hemoglobin Latest Ref Range: 13.0 - 17.0 g/dL 12.7 (L) 11.7 (L) 9.5 (L) 9.9 (L)     Results for Louis, Houston (MRN [id number]) as of 01/16/2020 11:01  Ref. Range 09/06/2019 11:48 11/09/2019 02:01 11/23/2019 12:23 11/25/2019 14:35 11/25/2019 14:36  Creatinine Latest Ref Range: 0.61 - 1.24 mg/dL 1.17 1.30 (H) 1.31 (H)  SYMPTOM SCALE - ILD 01/16/2020   O2 use 2L QHS only  Shortness of Breath 0 -> 5 scale with 5 being worst (score 6 If unable to do)  At rest x  Simple tasks - showers, clothes change, eating, shaving x  Household (dishes, doing bed, laundry) x  Shopping xx  Walking level at own pace x  Walking up Stairs x  Total (30-36) Dyspnea Score x  How bad is your cough? x  How bad is your fatigue x  How bad is nausea x  How bad is vomiting?  x  How bad is diarrhea? x  How bad is anxiety? x  How bad is depression x    Simple office walk 185 feet x  3 laps goal with forehead probe 01/16/2020   O2 used ra  Number laps completed 1 of 3 only and then desaturated  Comments  about pace Slow with cane  Resting Pulse Ox/HR 97% and 68/min  Final Pulse Ox/HR 88% and 65/min  Desaturated </= 88% yes  Desaturated <= 3% points Yes, 9 points  Got Tachycardic >/= 90/min 9  Symptoms at end of test no  Miscellaneous comments Dyspneic, needed 2L [place] to correct        ECHO 08/26/19   IMPRESSIONS    1. There is prominent septal bounce, consider constriction. Left  ventricular ejection fraction, by estimation, is 55 to 60%. The left  ventricle has normal function. The left ventricle has no regional wall  motion abnormalities. Left ventricular diastolic  parameters are consistent with Grade I diastolic dysfunction (impaired  relaxation). Elevated left ventricular end-diastolic pressure.  2. Right ventricular systolic function is normal. The right ventricular  size is severely enlarged. There is severely elevated pulmonary artery  systolic pressure. The estimated right ventricular systolic pressure is  34.7 mmHg.  3. Left atrial size was mildly dilated.  4. Right atrial size was moderately dilated.  5. The mitral valve is normal in structure. Trivial mitral valve  regurgitation. No evidence of mitral stenosis.  6. Tricuspid valve regurgitation is moderate.  7. The aortic valve is tricuspid. Aortic valve regurgitation is not  visualized. No aortic stenosis is present.  8. The inferior vena cava is dilated in size with <50% respiratory  variability, suggesting right atrial pressure of 15 mmHg.   Conclusion(s)/Recommendation(s): Severe pulmonary hypertension, with RV  dilation. Prominent ventricular septal bounce, consider constriction.    IMPRESSION: CT chest high resolution 1. Limited motion degraded scan. 2. Moderate centrilobular and paraseptal emphysema with diffuse bronchial wall thickening, suggesting COPD. 3. Stable mild smooth bilateral pleural thickening posteriorly with associated faint pleural calcifications, cannot exclude asbestos related  pleural disease. No pleural effusions. 4. Suggestion of mild subpleural reticulation and ground-glass opacity at the lung bases, poorly evaluated due to motion degradation, not definitely changed since 09/06/2019 high-resolution chest CT study. No definite regions of traction bronchiectasis or frank honeycombing. A mild interstitial lung disease such as early asbestosis or UIP cannot be excluded. Consider follow-up high-resolution chest CT study in 6-12 months, as clinically warranted. Findings are indeterminate for UIP per consensus guidelines: Diagnosis of Idiopathic Pulmonary Fibrosis: An Official ATS/ERS/JRS/ALAT Clinical Practice Guideline. [place], Iss 5, [phone], Jan 03 2017. 5. Stable mild cardiomegaly. Stable dilated main pulmonary artery, suggesting chronic pulmonary arterial hypertension. 6. Aortic Atherosclerosis (ICD10-I70.0) and Emphysema (ICD10-J43.9).   Electronically Signed   By: Ilona Sorrel M.D.   On: 12/21/2019 10:51   ROS - per HPI  No flowsheet data found.  RIGHT HEART CATH - 11/25/2019  Findings:  RA = 8 RV = 68/7 PA = 71/21 (39) PCW = 7 Fick cardiac output/index = 4.2/2.1 PVR = 7.6 WU Ao sat = 99% PA sat = 59%, 60%  Assessment: 1. Mild to moderate PAH with normal left-sided pressures  Plan/Discussion:  Suspect primarily WHO Group 3 PAH but may have component of WHO Group 1. Need to make sure he is complaint with supplemental O2 and CPAP. Once hypoxemia addressed will repeat echo. If still with RV strain can consider careful trial of PDE-5.  Glori Bickers, MD  2:47 PM   xxxxxxxxxxxxxxxxxxxxxxxxxxxxxxxxxxxxxxxxxx  OV 02/13/20    02/13/2020-interim history Patient presents today for 1 month follow-up for Pulmonary HTN and chronic respiratory failure. During last visit he was started on BREO and recommended to discuss whether or not Spiriva was contributing to bladder issues.  Patient was started on  Innogen portable oxygen concentrator at 2 L nasal cannula.  Lab work for [employer], rheumatoid factor, SCL-70, SSA, ssb and CCP checked.   He reports doing some better on Breo. He is off Spiriva, daughter has noticed a significant improvement in his sleeping at night since being off of this. Occasional cough.  He has been compliant with Innogen and is wearing 2L today in office. He wears oxygen on exertion and at night. He is established with [employer]. ANA was positive and titer was 1:320. CCP <16. RF <14. He has a hx of psoriasis and lupus does run in his family. He saw cardiology 1 week ago. BMET checked 01/30/20, 20MEQ KCL added daily. They re-ordered sleep study but have not heard back to schedule sleep study. Per their note they are awaiting insurance approval.      OV 04/03/2020  Subjective:  Patient ID: Louis Houston, male , DOB: [birth year] , age 31 y.o. , MRN: [id number] , ADDRESS: [address] [place] [address] PCP Nicoletta Dress, MD Patient Care Team: Nicoletta Dress, MD as PCP - General (Internal Medicine) Bensimhon, Shaune Pascal, MD as PCP - Advanced Heart Failure (Cardiology)  This Provider for this visit: Treatment Team:  Attending Provider: Brand Males, MD    04/03/2020 -   Chief Complaint  Patient presents with  . Follow-up    ILD, doing well, SOB with exertion   Multifactorial dyspnea and hypoxemia chronic hypoxemic respiratory failure on account of emphysema, possible asbestos related ILD [scan August 2021 equivocal], pulmonary hypertension followed by Dr. Haroldine Laws, and previous history of sleep apnea  HPI Louis Houston 86 y.o. -presents for follow-up with his daughter.  Since last seeing him he is seen nurse practitioner Derl Barrow.  He had a positive ANA and was referred to Dr. Benjamine Mola in rheumatology.  I reviewed his note.  There is no overt indication of autoimmune disease.  Probably a high titer ANA from aging.  He is on expectant follow-up for that.  From  a respiratory standpoint he tells me overall he is stable.  He still has significant symptoms.  This is documented below.  Review of the notes indicate that Dr. Haroldine Laws has tried to establish patient with a sleep study but this was not scheduled.  Then he saw his regular cardiologist Dr. Raliegh Ip who is now scheduled a sleep study for the patient.  However this is scheduled later in December at [facility]  The daughter and the patient feel that the location will cause significant insomnia and therefore they are requesting  a home sleep study test.  He does not have an established sleep physician.  Dr. Clayborne Dana notes indicate that the sleep study will be critical in deciding future therapy of the pulmonary hypertension.  In terms of his emphysema he continues on [place].  He stopped the Spiriva on account of bladder issues but the bladder issues did not resolve.  We discussed about restarting Spiriva but the daughter felt that Spiriva was actually causing insomnia and sleep quality is better off the Spiriva.  Patient also feels that his dyspnea is not any worse without Spiriva therefore they are going to be content doing Breo.    He is up-to-date with his vaccines.  SYMPTOM SCALE -    O2 use Exertion and night and uusally at rest too - 2L   Shortness of Breath 0 -> 5 scale with 5 being worst (score 6 If unable to do)  At rest 0  Simple tasks - showers, clothes change, eating, shaving 5  Household (dishes, doing bed, laundry) 3  Shopping na  Walking level at own pace 5  Walking up Stairs 6  Total (30-36) Dyspnea Score x  How bad is your cough? 3  How bad is your fatigue 5  How bad is nausea 0  How bad is vomiting?  0  How bad is diarrhea? 0  How bad is anxiety? 0  How bad is depression 0          ROS - per HPI     has a past medical history of Centrilobular emphysema ([place]), Chronic respiratory failure with hypoxia ([place]), Coronary artery disease involving native coronary artery  of native heart without angina pectoris, Dyslipidemia, Dyspnea on exertion, Essential hypertension, ILD (interstitial lung disease) ([place]), Obstructive sleep apnea, Psoriasis, Pulmonary hypertension ([place]), Status post coronary artery bypass graft, and Type 2 diabetes mellitus ([place]).   reports that he quit smoking about 56 years ago. His smoking use included cigarettes. He has never used smokeless tobacco.  Past Surgical History:  Procedure Laterality Date  . CARDIAC SURGERY    . GALLBLADDER SURGERY    . PROSTATE SURGERY    . RIGHT HEART CATH N/A 11/25/2019   Procedure: RIGHT HEART CATH;  Surgeon: Jolaine Artist, MD;  Location: [facility];  Service: Cardiovascular;  Laterality: N/A;    Allergies  Allergen Reactions  . Flomax [Tamsulosin] Other (See Comments)    Drop blood pressure    Immunization History  Administered Date(s) Administered  . Influenza-Unspecified 03/05/2018, 01/11/2019  . PFIZER SARS-COV-2 Vaccination 05/11/2019, 06/11/2019, 02/09/2020    Family History  Problem Relation Age of Onset  . Diabetes Mother   . Heart disease Mother   . Stroke Father   . Diabetes Daughter   . Lupus Daughter   . Hypertension Daughter   . Lupus Niece   . Diabetes Daughter   . Ulcers Daughter        Stomach Ulcers  . Hypertension Daughter   . Osteoarthritis Daughter   . Psoriasis Daughter      Current Outpatient Medications:  .  amLODipine (NORVASC) 5 MG tablet, Take 5 mg by mouth daily., Disp: , Rfl:  .  aspirin 81 MG EC tablet, Take 81 mg by mouth daily., Disp: , Rfl: 12 .  ciprofloxacin (CIPRO) 250 MG tablet, Take 250 mg by mouth 2 (two) times daily., Disp: , Rfl:  .  fluticasone furoate-vilanterol (BREO ELLIPTA) 100-25 MCG/INH AEPB, Inhale 1 puff into the lungs daily., Disp: 28 each, Rfl:  3 .  furosemide (LASIX) 40 MG tablet, TAKE ONE TABLET BY MOUTH TWICE DAILY, Disp: 60 tablet, Rfl: 3 .  glipiZIDE (GLUCOTROL) 5 MG tablet, Take 5 mg by mouth 2 (two) times daily.  , Disp: , Rfl:  .  hydrALAZINE (APRESOLINE) 25 MG tablet, Take 25 mg by mouth 2 (two) times daily. , Disp: , Rfl: 12 .  lisinopril (PRINIVIL,ZESTRIL) 40 MG tablet, Take 40 mg by mouth at bedtime. , Disp: , Rfl:  .  metFORMIN (GLUCOPHAGE) 500 MG tablet, Take 500 mg by mouth daily. , Disp: , Rfl:  .  metoprolol tartrate (LOPRESSOR) 50 MG tablet, Take 50 mg by mouth 2 (two) times daily. , Disp: , Rfl:  .  Multiple Vitamin (MULTI-VITAMINS) TABS, Take 1 tablet by mouth daily., Disp: , Rfl:  .  potassium chloride (KLOR-CON) 10 MEQ tablet, TAKE 2 TABLETS BY MOUTH TWICE DAILY, Disp: 120 tablet, Rfl: 1 .  simvastatin (ZOCOR) 40 MG tablet, Take 40 mg by mouth every evening. , Disp: , Rfl:       Objective:   Vitals:   04/03/20 1041  BP: 112/72  Pulse: 74  Temp: (!) 97.1 F (36.2 C)  TempSrc: Oral  SpO2: (!) 87%  Weight: 180 lb 6.4 oz (81.8 kg)  Height: _0  (1.778 m)    87% on ROOM AIR  Estimated body mass index is 25.88 kg/m as calculated from the following:   Height as of this encounter: _1  (1.778 m).   Weight as of this encounter: 180 lb 6.4 oz (81.8 kg).  _2 @  Filed Weights   04/03/20 1041  Weight: 180 lb 6.4 oz (81.8 kg)     Physical Exam  General: No distress. Looks stable frail on o2 Neuro: Alert and Oriented x 3. GCS 15. Speech normal Psych: Pleasant Resp:  Barrel Chest - no.  Wheeze - no, Crackles - no, No overt respiratory distress CVS: Normal heart sounds. Murmurs - no Ext: Stigmata of Connective Tissue Disease - no HEENT: Normal upper airway. PEERL +. No post nasal drip        Assessment:       ICD-10-CM   1. Chronic respiratory failure with hypoxia (HCC)  J96.11   2. Centrilobular emphysema ([place])  J43.2   3. Pulmonary hypertension (HCC)  I27.20   4. 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 History of sleep apnea  Z86.69   6. Daytime somnolence  R40.0        Plan:     Patient Instructions  Chronic respiratory  failure with hypoxia (HCC) Centrilobular emphysema (HCC)  -Currently stable disease  Plan -Continue Breo scheduled -Continue oxygen with exertion and at night and as needed during rest with goal pulse ox greater than 88% -Hold off on Spiriva due to insomnia  Pulmonary hypertension ([place])  -Per Dr. 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  -You might or might not have interstitial lung disease associated with asbestos exposure  Plan -Repeat high-resolution CT chest in August 2022   History of sleep apnea Daytime somnolence  Given prior history of sleep apnea and current ongoing pulmonary issues I agree we need to establish if he has sleep apnea or not at this point in time  Supportive concern of insomnia if he were to do the sleep study at [facility] later in December 2021  Plan - make referral for home sleep study due to inability to do at [place] long due to age and fear of insomia -Refer sleep specialist Dr. Golden Hurter or any of the sleep specialist within pulmonary -however first available   Follow-up -6 months or sooner if needed      SIGNATURE    Dr. Brand Males, M.D., F.C.C.P,  Pulmonary and Critical Care Medicine Staff Physician, [place] Director - Interstitial Lung Disease  Program  Pulmonary [place] [facility] [place], [place], [address]  Pager: [phone], If no answer or between  15:00h - 7:00h: call [phone] Telephone: [phone]  11:10 AM 04/03/2020

## 2020-04-15 NOTE — Progress Notes (Signed)
ADVANCED HF CLINIC PROGRESS NOTE  Referring Physician: Agustin Cree   Primary Cardiologist: Agustin Cree [facility]: Dr. Haroldine Laws   HPI:  Louis Houston is an 86 y.o. male with CAD s/p CABG, DM2, HTN. HL and OSA. Referred by Dr. Agustin Cree for further evaluation of pulmonary HTN seen on echo. Former smoker for ~ 20 yrs 1-2ppd. Quit in 1966 due to recurrent colds.  He has been following with Dr. Raliegh Ip for years and has had stable mild to moderate PAH on echo without many symptoms. Recently more DOE. Repeat echo 4/21 as below. Attempts at diuresis made but not very successful.   CT scan by PCP. No PE.  His primary care physician also did pulmonary function testing which showed moderately severe obstructive airways disease (see below) and he was started on bronchodilators.  We first saw him 7/21. Underwent RHC with mild-moderate PAH with normal left-sided pressures. Suspected WHO Group 3 PAH but may have component of WHO Group 1. Recommendations were to manage hypoxia w/ improved compliance w/ home O2 and treatment w/ CPAP w/ plans to repeat echo after hypoxemia was better corrected. If still w/ evidence of RV strain, plan was for careful trial of PDE5i therapy. Home sleep study ordered.   Several months ago developed urinary retention and has indwelling Foley which is changed monthly.   He presents back to clinic today w/ his daughter [name]. He is wearing supp O2  At 3L/min. Has sleep study schedule next Monday as inpatient but pending referral for home sleep study. Remains SOB with any activity. Orthopnea has resolved. No PND. No CP or pressure.     P[facility]S from [facility] 5/21 - moderately severe obstruction FEV1 1.43 (54%) FVC 2.15 (56%) FEF 25-75% 0.68 (40%)  RHC 11/25/19   RA = 8 RV = 68/7 PA = 71/21 (39) PCW = 7 Fick cardiac output/index = 4.2/2.1 PVR = 7.6 WU Ao sat = 99% PA sat = 59%, 60%  Assessment: 1. Mild to moderate PAH with normal left-sided  pressures  Plan/Discussion:  Suspect primarily WHO Group 3 PAH but may have component of WHO Group 1. Need to make sure he is complaint with supplemental O2 and CPAP. Once hypoxemia addressed will repeat echo. If still with RV strain can consider careful trial of PDE-5.    ECHO 08/26/19 1. There is prominent septal bounce, consider constriction. Left  ventricular ejection fraction, by estimation, is 55 to 60%. The left  ventricle has normal function. The left ventricle has no regional wall  motion abnormalities. Left ventricular diastolic  parameters are consistent with Grade I diastolic dysfunction (impaired  relaxation). Elevated left ventricular end-diastolic pressure.  2. Right ventricular systolic function is normal. The right ventricular  size is severely enlarged. There is severely elevated pulmonary artery  systolic pressure. The estimated right ventricular systolic pressure is  74.1 mmHg.  3. Left atrial size was mildly dilated.  4. Right atrial size was moderately dilated.  5. The mitral valve is normal in structure. Trivial mitral valve  regurgitation. No evidence of mitral stenosis.  6. Tricuspid valve regurgitation is moderate.  7. The aortic valve is tricuspid. Aortic valve regurgitation is not  visualized. No aortic stenosis is present.  8. The inferior vena cava is dilated in size with <50% respiratory  variability, suggesting right atrial pressure of 15 mmHg.   Review of systems complete and found to be negative unless listed in HPI.       Current Outpatient Medications  Medication Sig Dispense Refill  amLODipine (NORVASC) 5 MG tablet Take 5 mg by mouth daily.     aspirin 81 MG EC tablet Take 81 mg by mouth daily.  12   ciprofloxacin (CIPRO) 250 MG tablet Take 250 mg by mouth 2 (two) times daily.     fluticasone furoate-vilanterol (BREO ELLIPTA) 100-25 MCG/INH AEPB Inhale 1 puff into the lungs daily. 28 each 3   furosemide (LASIX) 40 MG tablet  TAKE ONE TABLET BY MOUTH TWICE DAILY 60 tablet 3   glipiZIDE (GLUCOTROL) 5 MG tablet Take 5 mg by mouth 2 (two) times daily.      hydrALAZINE (APRESOLINE) 25 MG tablet Take 25 mg by mouth 2 (two) times daily.   12   lisinopril (PRINIVIL,ZESTRIL) 40 MG tablet Take 40 mg by mouth at bedtime.      metFORMIN (GLUCOPHAGE) 500 MG tablet Take 500 mg by mouth daily.      metoprolol tartrate (LOPRESSOR) 50 MG tablet Take 50 mg by mouth 2 (two) times daily.      Multiple Vitamin (MULTI-VITAMINS) TABS Take 1 tablet by mouth daily.     potassium chloride (KLOR-CON) 10 MEQ tablet TAKE 2 TABLETS BY MOUTH TWICE DAILY 120 tablet 1   simvastatin (ZOCOR) 40 MG tablet Take 40 mg by mouth every evening.      No current facility-administered medications for this encounter.    Allergies  Allergen Reactions   Flomax [Tamsulosin] Other (See Comments)    Drop blood pressure      Social History   Socioeconomic History   Marital status: Widowed    Spouse name: Not on file   Number of children: Not on file   Years of education: Not on file   Highest education level: Not on file  Occupational History   Not on file  Tobacco Use   Smoking status: Former Smoker    Types: Cigarettes    Quit date: 1965    Years since quitting: 56.9   Smokeless tobacco: Never Used  Scientific laboratory technician Use: Never used  Substance and Sexual Activity   Alcohol use: Not Currently   Drug use: Not Currently   Sexual activity: Not on file  Other Topics Concern   Not on file  Social History Narrative   Not on file   Social Determinants of Health   Financial Resource Strain: Not on file  Food Insecurity: Not on file  Transportation Needs: Not on file  Physical Activity: Not on file  Stress: Not on file  Social Connections: Not on file  Intimate Partner Violence: Not on file      Family History  Problem Relation Age of Onset   Diabetes Mother    Heart disease Mother    Stroke Father     Diabetes Daughter    Lupus Daughter    Hypertension Daughter    Lupus Niece    Diabetes Daughter    Ulcers Daughter        Stomach Ulcers   Hypertension Daughter    Osteoarthritis Daughter    Psoriasis Daughter     Vitals:   04/16/20 1343  BP: 118/70  Pulse: 60  SpO2: 98%  Weight: 82.3 kg (181 lb 6.4 oz)   PHYSICAL EXAM: General:  Elderly male, wearing supp O2 via [place]. No respiratory difficulty HEENT: normal Neck: supple. no JVD. Carotids 2+ bilat; no bruits. No lymphadenopathy or thryomegaly appreciated. Cor: PMI nondisplaced. Regular rate & rhythm. No rubs, gallops or murmurs. Lungs: clear with decreased breath sounds  Abdomen: soft, nontender, nondistended. No hepatosplenomegaly. No bruits or masses. Good bowel sounds. Extremities: no cyanosis, clubbing, rash, edema Neuro: alert & orientedx3, cranial nerves grossly intact. moves all 4 extremities w/o difficulty. Affect pleasant    ASSESSMENT & PLAN:  1. Pulmonary HTN by echo 4/21 -RVSP 81.9 mmHg on echo 4/21. RV enlarged but systolic function normal  - suspect multifactorial - CT no evidence of PE (no VQ done) - PFTs reportedly with moderate severe obstructive airways disease. Suspect primary component of AH (WHO Group 3) - Has OSA but noncompliant with CPAP - ANA 1:320 but remainder of AI work-up negative (has seen Rheum) - RHC 7/21 mild-moderate PAH with normal left-sided pressures. Suspect primarily WHO Group 3 PAH but may have component of WHO Group 1.  - he has improved compliance w/ daytime Home O2 but still awaiting repeat sleep study for CPAP - improved on home O2. once on CPAP therapy, plan to repeat 2D echo to reassess RVSP. If still significant RV strain, can consider very careful trial of PDE5i therapy.   2. CAD s/p CABG - no s/s ischemia - continue ASA. - had previously consdiered SGLT2i  however would avoid now that he has chronic foley for urinary retention   3. OSA - no longer on CPAP - last  sleep study was > 10 years ago  - needs repeat study - being scheduled  4. Chronic hypoxic respiratory failure - on 2-3L home O2 - Follows with Dr. Drema Pry, MD  7:50 PM

## 2020-04-16 ENCOUNTER — Encounter (HOSPITAL_COMMUNITY): Payer: Self-pay | Admitting: Internal Medicine

## 2020-04-16 ENCOUNTER — Other Ambulatory Visit: Payer: Self-pay

## 2020-04-16 ENCOUNTER — Ambulatory Visit (HOSPITAL_COMMUNITY)
Admission: RE | Admit: 2020-04-16 | Discharge: 2020-04-16 | Disposition: A | Discharge status: Home / Self Care | Payer: Medicare Other | Source: Ambulatory Visit | Attending: Internal Medicine | Admitting: Internal Medicine

## 2020-04-16 VITALS — BP 118/70 | HR 60 | Wt 181.4 lb

## 2020-04-16 DIAGNOSIS — I10 Essential (primary) hypertension: Secondary | ICD-10-CM | POA: Insufficient documentation

## 2020-04-16 DIAGNOSIS — Z9119 Patient's noncompliance with other medical treatment and regimen: Secondary | ICD-10-CM | POA: Diagnosis not present

## 2020-04-16 DIAGNOSIS — G4733 Obstructive sleep apnea (adult) (pediatric): Secondary | ICD-10-CM | POA: Diagnosis not present

## 2020-04-16 DIAGNOSIS — Z7982 Long term (current) use of aspirin: Secondary | ICD-10-CM | POA: Insufficient documentation

## 2020-04-16 DIAGNOSIS — R339 Retention of urine, unspecified: Secondary | ICD-10-CM | POA: Insufficient documentation

## 2020-04-16 DIAGNOSIS — Z79899 Other long term (current) drug therapy: Secondary | ICD-10-CM | POA: Diagnosis not present

## 2020-04-16 DIAGNOSIS — E119 Type 2 diabetes mellitus without complications: Secondary | ICD-10-CM | POA: Diagnosis not present

## 2020-04-16 DIAGNOSIS — J9611 Chronic respiratory failure with hypoxia: Secondary | ICD-10-CM | POA: Diagnosis not present

## 2020-04-16 DIAGNOSIS — I071 Rheumatic tricuspid insufficiency: Secondary | ICD-10-CM | POA: Insufficient documentation

## 2020-04-16 DIAGNOSIS — Z7984 Long term (current) use of oral hypoglycemic drugs: Secondary | ICD-10-CM | POA: Insufficient documentation

## 2020-04-16 DIAGNOSIS — I272 Pulmonary hypertension, unspecified: Secondary | ICD-10-CM | POA: Diagnosis not present

## 2020-04-16 DIAGNOSIS — E785 Hyperlipidemia, unspecified: Secondary | ICD-10-CM | POA: Insufficient documentation

## 2020-04-16 DIAGNOSIS — I5032 Chronic diastolic (congestive) heart failure: Secondary | ICD-10-CM | POA: Diagnosis not present

## 2020-04-16 DIAGNOSIS — Z7951 Long term (current) use of inhaled steroids: Secondary | ICD-10-CM | POA: Diagnosis not present

## 2020-04-16 DIAGNOSIS — I2510 Atherosclerotic heart disease of native coronary artery without angina pectoris: Secondary | ICD-10-CM | POA: Diagnosis not present

## 2020-04-16 DIAGNOSIS — Z87891 Personal history of nicotine dependence: Secondary | ICD-10-CM | POA: Insufficient documentation

## 2020-04-16 DIAGNOSIS — Z951 Presence of aortocoronary bypass graft: Secondary | ICD-10-CM | POA: Diagnosis not present

## 2020-04-16 HISTORY — DX: Heart failure, unspecified: I50.9

## 2020-04-16 NOTE — Patient Instructions (Signed)
Please call our office in March 2022 to schedule your follow up appointment  If you have any questions or concerns before your next appointment please send [place] a message through [place] or call our office at [phone].    TO LEAVE A MESSAGE FOR THE NURSE SELECT OPTION 2, PLEASE LEAVE A MESSAGE INCLUDING: . YOUR NAME . DATE OF BIRTH . CALL BACK NUMBER . REASON FOR CALL**this is important as we prioritize the call backs  [place] AS LONG AS YOU CALL BEFORE 4:00 PM  At the [facility], you and your health needs are our priority. As part of our continuing mission to provide you with exceptional heart care, we have created designated Provider Care Teams. These Care Teams include your primary Cardiologist (physician) and Advanced Practice Providers (APPs- Physician Assistants and Nurse Practitioners) who all work together to provide you with the care you need, when you need it.   You may see any of the following providers on your designated Care Team at your next follow up: Marland Kitchen Dr Glori Bickers . Dr Loralie Champagne . Darrick Grinder, NP . Lyda Jester, PA . Audry Riles, PharmD   Please be sure to bring in all your medications bottles to every appointment.

## 2020-04-16 NOTE — Addendum Note (Signed)
Encounter addended by: Scarlette Calico, RN on: 04/16/2020 2:13 PM  Actions taken: Clinical Note Signed

## 2020-04-18 DIAGNOSIS — N35013 Post-traumatic anterior urethral stricture: Secondary | ICD-10-CM | POA: Diagnosis not present

## 2020-04-23 ENCOUNTER — Encounter (HOSPITAL_BASED_OUTPATIENT_CLINIC_OR_DEPARTMENT_OTHER): Payer: Medicare Other | Admitting: Cardiovascular Disease

## 2020-05-01 ENCOUNTER — Ambulatory Visit: Payer: Medicare Other

## 2020-05-01 ENCOUNTER — Other Ambulatory Visit: Payer: Self-pay

## 2020-05-01 DIAGNOSIS — G4733 Obstructive sleep apnea (adult) (pediatric): Secondary | ICD-10-CM | POA: Diagnosis not present

## 2020-05-01 DIAGNOSIS — R400 Somnolence: Secondary | ICD-10-CM

## 2020-05-01 DIAGNOSIS — Z8669 Personal history of other diseases of the nervous system and sense organs: Secondary | ICD-10-CM

## 2020-05-02 DIAGNOSIS — G4733 Obstructive sleep apnea (adult) (pediatric): Secondary | ICD-10-CM | POA: Diagnosis not present

## 2020-05-02 DIAGNOSIS — I2510 Atherosclerotic heart disease of native coronary artery without angina pectoris: Secondary | ICD-10-CM | POA: Diagnosis not present

## 2020-05-02 DIAGNOSIS — I270 Primary pulmonary hypertension: Secondary | ICD-10-CM | POA: Diagnosis not present

## 2020-05-02 DIAGNOSIS — I10 Essential (primary) hypertension: Secondary | ICD-10-CM | POA: Diagnosis not present

## 2020-05-18 DIAGNOSIS — R338 Other retention of urine: Secondary | ICD-10-CM | POA: Diagnosis not present

## 2020-05-28 ENCOUNTER — Other Ambulatory Visit: Payer: Self-pay | Admitting: Internal Medicine

## 2020-05-28 ENCOUNTER — Other Ambulatory Visit (HOSPITAL_COMMUNITY): Payer: Self-pay | Admitting: Internal Medicine

## 2020-05-28 MED ORDER — POTASSIUM CHLORIDE ER 10 MEQ PO TBCR: 20.0000 meq | EXTENDED_RELEASE_TABLET | Freq: Two times a day (BID) | ORAL | 11 refills | Status: AC

## 2020-05-28 NOTE — Addendum Note (Signed)
Addended by: Shela Nevin R on: 9/32/3557 02:58 PM   Modules accepted: Orders

## 2020-06-02 DIAGNOSIS — G4733 Obstructive sleep apnea (adult) (pediatric): Secondary | ICD-10-CM | POA: Diagnosis not present

## 2020-06-02 DIAGNOSIS — I270 Primary pulmonary hypertension: Secondary | ICD-10-CM | POA: Diagnosis not present

## 2020-06-02 DIAGNOSIS — I10 Essential (primary) hypertension: Secondary | ICD-10-CM | POA: Diagnosis not present

## 2020-06-02 DIAGNOSIS — I2510 Atherosclerotic heart disease of native coronary artery without angina pectoris: Secondary | ICD-10-CM | POA: Diagnosis not present

## 2020-06-12 DIAGNOSIS — I509 Heart failure, unspecified: Secondary | ICD-10-CM | POA: Insufficient documentation

## 2020-06-12 DIAGNOSIS — L409 Psoriasis, unspecified: Secondary | ICD-10-CM | POA: Insufficient documentation

## 2020-06-12 DIAGNOSIS — E119 Type 2 diabetes mellitus without complications: Secondary | ICD-10-CM | POA: Insufficient documentation

## 2020-06-15 DIAGNOSIS — R338 Other retention of urine: Secondary | ICD-10-CM | POA: Diagnosis not present

## 2020-06-22 ENCOUNTER — Ambulatory Visit: Payer: Medicare Other | Admitting: Cardiology

## 2020-06-22 ENCOUNTER — Other Ambulatory Visit: Payer: Self-pay

## 2020-06-22 ENCOUNTER — Encounter: Payer: Self-pay | Admitting: Cardiology

## 2020-06-22 VITALS — BP 102/50 | HR 79 | Ht 70.0 in | Wt 179.0 lb

## 2020-06-22 DIAGNOSIS — E785 Hyperlipidemia, unspecified: Secondary | ICD-10-CM | POA: Diagnosis not present

## 2020-06-22 DIAGNOSIS — I272 Pulmonary hypertension, unspecified: Secondary | ICD-10-CM

## 2020-06-22 DIAGNOSIS — Z79899 Other long term (current) drug therapy: Secondary | ICD-10-CM | POA: Diagnosis not present

## 2020-06-22 DIAGNOSIS — I270 Primary pulmonary hypertension: Secondary | ICD-10-CM | POA: Diagnosis not present

## 2020-06-22 DIAGNOSIS — I10 Essential (primary) hypertension: Secondary | ICD-10-CM | POA: Diagnosis not present

## 2020-06-22 DIAGNOSIS — I2510 Atherosclerotic heart disease of native coronary artery without angina pectoris: Secondary | ICD-10-CM

## 2020-06-22 DIAGNOSIS — Z951 Presence of aortocoronary bypass graft: Secondary | ICD-10-CM | POA: Diagnosis not present

## 2020-06-22 DIAGNOSIS — J449 Chronic obstructive pulmonary disease, unspecified: Secondary | ICD-10-CM | POA: Diagnosis not present

## 2020-06-22 DIAGNOSIS — E1169 Type 2 diabetes mellitus with other specified complication: Secondary | ICD-10-CM | POA: Diagnosis not present

## 2020-06-22 MED ORDER — AMLODIPINE BESYLATE 5 MG PO TABS
5.0000 mg | ORAL_TABLET | Freq: Every day | ORAL | Status: DC
Start: 2020-06-22 — End: 2020-06-22

## 2020-06-22 NOTE — Progress Notes (Signed)
Cardiology Office Note:    Date:  06/22/2020   ID:  JAKEVION ARNEY, DOB [birth year], MRN [id number]  PCP:  Nicoletta Dress, MD  Cardiologist:  Jenne Campus, MD    Referring MD: Nicoletta Dress, MD   Chief Complaint  Patient presents with  . Low BP   . Sleep Study results  Am still short of breath  History of Present Illness:    Louis Houston is a 87 y.o. male with past medical history significant for coronary artery disease, status post coronary bypass graft years ago, essential hypertension, dyslipidemia, dyspnea on exertion, pulmonary hypertension.  He did have right side cardiac catheterization done which showed pulmonary hypertension probably group 3 with possibility of component of group 1 as well.  He is wearing oxygen all the time he is compliant with that.  He also got sleep study and the purpose of visit today is to discuss his sleep study.  His sleep study clearly showed moderate obstructive sleep apnea with some hypoxia with oxygen dropping down to 74% during the night.  I discussed with him as well as with his daughter the fact that he need to use CPAP mask.  He told me he absolutely does not want to do that.  He does have equipment at home but does not want to use it.  He does have a incoming appointment with pulmonary I asked his daughter to talk to him more about this and try to convince him for need to do use this equipment. He complained of having shortness of breath he said is getting worse.  Is more difficult to do for him anything.  Any effort will bring shortness of breath.  There is no chest pain tightness squeezing pressure burning chest no dizziness no passing out no swelling of lower extremities.  Past Medical History:  Diagnosis Date  . Centrilobular emphysema ([place])   . CHF (congestive heart failure) ([place])   . Chronic respiratory failure with hypoxia ([employer])   . Coronary artery disease involving native coronary artery of native heart without angina pectoris    . Dyslipidemia   . Dyspnea on exertion   . Essential hypertension   . Hypotension 03/20/2020  . ILD (interstitial lung disease) ([place])   . Obstructive sleep apnea   . Plaque psoriasis 03/20/2020  . Positive ANA (antinuclear antibody) 03/20/2020  . Psoriasis   . Pulmonary hypertension ([place])   . Status post coronary artery bypass graft   . Type 2 diabetes mellitus ([place])     Past Surgical History:  Procedure Laterality Date  . CARDIAC SURGERY    . GALLBLADDER SURGERY    . PROSTATE SURGERY    . RIGHT HEART CATH N/A 11/25/2019   Procedure: RIGHT HEART CATH;  Surgeon: Jolaine Artist, MD;  Location: [facility];  Service: Cardiovascular;  Laterality: N/A;    Current Medications: Current Meds  Medication Sig  . aspirin 81 MG EC tablet Take 81 mg by mouth daily.  Marland Kitchen BREO ELLIPTA 100-25 MCG/INH AEPB Inhale 1 puff into the lungs daily. (Patient taking differently: Inhale 1 puff into the lungs daily as needed (sob).)  . furosemide (LASIX) 40 MG tablet TAKE ONE TABLET BY MOUTH TWICE DAILY  . glipiZIDE (GLUCOTROL) 5 MG tablet Take 5 mg by mouth 2 (two) times daily.   Marland Kitchen lisinopril (PRINIVIL,ZESTRIL) 40 MG tablet Take 40 mg by mouth at bedtime.   . metFORMIN (GLUCOPHAGE) 500 MG tablet Take 500 mg by mouth daily.   Marland Kitchen  metoprolol tartrate (LOPRESSOR) 50 MG tablet Take 50 mg by mouth 2 (two) times daily.   . Multiple Vitamin (MULTI-VITAMINS) TABS Take 1 tablet by mouth daily.  . potassium chloride (KLOR-CON) 10 MEQ tablet Take 2 tablets (20 mEq total) by mouth 2 (two) times daily.  . simvastatin (ZOCOR) 40 MG tablet Take 40 mg by mouth every evening.   . [DISCONTINUED] amLODipine (NORVASC) 5 MG tablet Take 5 mg by mouth daily.  . [DISCONTINUED] hydrALAZINE (APRESOLINE) 25 MG tablet Take 25 mg by mouth daily.     Allergies:   Flomax [tamsulosin]   Social History   Socioeconomic History  . Marital status: Widowed    Spouse name: Not on file  . Number of children: Not on file  .  Years of education: Not on file  . Highest education level: Not on file  Occupational History  . Not on file  Tobacco Use  . Smoking status: Former Smoker    Types: Cigarettes    Quit date: 1965    Years since quitting: 9.1  . Smokeless tobacco: Never Used  Vaping Use  . Vaping Use: Never used  Substance and Sexual Activity  . Alcohol use: Not Currently  . Drug use: Not Currently  . Sexual activity: Not on file  Other Topics Concern  . Not on file  Social History Narrative  . Not on file   Social Determinants of Health   Financial Resource Strain: Not on file  Food Insecurity: Not on file  Transportation Needs: Not on file  Physical Activity: Not on file  Stress: Not on file  Social Connections: Not on file     Family History: The patient's family history includes Diabetes in his daughter, daughter, and mother; Heart disease in his mother; Hypertension in his daughter and daughter; Lupus in his daughter and niece; Osteoarthritis in his daughter; Psoriasis in his daughter; Stroke in his father; Ulcers in his daughter. ROS:   Please see the history of present illness.    All 14 point review of systems negative except as described per history of present illness  EKGs/Labs/Other Studies Reviewed:      Recent Labs: 09/06/2019: NT-Pro BNP 2,176 01/16/2020: Hemoglobin 12.3; Platelets 385.0 02/22/2020: BUN 23; Creatinine, Ser 1.44; Potassium 4.1; Sodium 138  Recent Lipid Panel No results found for: CHOL, TRIG, HDL, CHOLHDL, VLDL, LDLCALC, LDLDIRECT  Physical Exam:    VS:  BP (!) 102/50 (BP Location: Right Arm, Patient Position: Sitting)   Pulse 79   Ht _0  (1.778 m)   Wt 179 lb (81.2 kg)   SpO2 (!) 89%   BMI 25.68 kg/m     Wt Readings from Last 3 Encounters:  06/22/20 179 lb (81.2 kg)  04/16/20 181 lb 6.4 oz (82.3 kg)  04/03/20 180 lb 6.4 oz (81.8 kg)     GEN:  Well nourished, well developed in no acute distress HEENT: Normal NECK: No JVD; No carotid  bruits LYMPHATICS: No lymphadenopathy CARDIAC: RRR, no murmurs, no rubs, no gallops, tones are distant RESPIRATORY:  Clear to auscultation without rales, wheezing or rhonchi, poor to bilaterally ABDOMEN: Soft, non-tender, non-distended MUSCULOSKELETAL:  No edema; No deformity  SKIN: Warm and dry LOWER EXTREMITIES: no swelling NEUROLOGIC:  Alert and oriented x 3 PSYCHIATRIC:  Normal affect   ASSESSMENT:    1. Status post coronary artery bypass graft   2. Coronary artery disease involving native coronary artery of native heart without angina pectoris   3. Pulmonary hypertension ([place])  4. Essential hypertension   5. Dyslipidemia    PLAN:    In order of problems listed above:  1. Status post coronary bypass graft doing well from that point of view, stable we will continue present management. 2. Pulmonary hypertension will be followed by our pulmonary clinic.  The goal right now is to improve his oxygenation hopefully by that we will be able to improve not only his symptomatology but also pulmonary pressure.  We may be forced to try some medication for possibility of component of group one of his pulmonary hypertension. 3. Essential hypertension, his blood pressure is actually low today I will ask him to stop hydralazine as well as amlodipine. 4. Dyslipidemia I did review his K PN which show me his LDL of 54 and HDL 43 this is from 03/21/2020, will continue present management.  I am afraid he is deteriorating.  I hope he will be able to accept our offer to treat his sleep apnea and that will make him feel better.   Medication Adjustments/Labs and Tests Ordered: Current medicines are reviewed at length with the patient today.  Concerns regarding medicines are outlined above.  No orders of the defined types were placed in this encounter.  Medication changes: No orders of the defined types were placed in this encounter.   Signed, Park Liter, MD, [facility] 06/22/2020 2:23 PM     [facility]

## 2020-06-22 NOTE — Patient Instructions (Addendum)
Medication Instructions:  Your physician has recommended you make the following change in your medication:   Stop Hydralazine and Amlodipine  *If you need a refill on your cardiac medications before your next appointment, please call your pharmacy*   Lab Work: None ordered If you have labs (blood work) drawn today and your tests are completely normal, you will receive your results only by: Marland Kitchen MyChart Message (if you have MyChart) OR . A paper copy in the mail If you have any lab test that is abnormal or we need to change your treatment, we will call you to review the results.   Testing/Procedures: None ordered   Follow-Up: At [facility], you and your health needs are our priority.  As part of our continuing mission to provide you with exceptional heart care, we have created designated Provider Care Teams.  These Care Teams include your primary Cardiologist (physician) and Advanced Practice Providers (APPs -  Physician Assistants and Nurse Practitioners) who all work together to provide you with the care you need, when you need it.  We recommend signing up for the patient portal called "MyChart".  Sign up information is provided on this After Visit Summary.  MyChart is used to connect with patients for Virtual Visits (Telemedicine).  Patients are able to view lab/test results, encounter notes, upcoming appointments, etc.  Non-urgent messages can be sent to your provider as well.   To learn more about what you can do with MyChart, go to [email or web address].    Your next appointment:   3 month(s)  The format for your next appointment:   In Person  Provider:   Jenne Campus, MD   Other Instructions NA

## 2020-06-27 ENCOUNTER — Other Ambulatory Visit: Payer: Self-pay | Admitting: Cardiology

## 2020-07-02 DIAGNOSIS — I10 Essential (primary) hypertension: Secondary | ICD-10-CM | POA: Diagnosis not present

## 2020-07-02 DIAGNOSIS — I2510 Atherosclerotic heart disease of native coronary artery without angina pectoris: Secondary | ICD-10-CM | POA: Diagnosis not present

## 2020-07-02 DIAGNOSIS — I270 Primary pulmonary hypertension: Secondary | ICD-10-CM | POA: Diagnosis not present

## 2020-07-02 DIAGNOSIS — G4733 Obstructive sleep apnea (adult) (pediatric): Secondary | ICD-10-CM | POA: Diagnosis not present

## 2020-07-13 DIAGNOSIS — R338 Other retention of urine: Secondary | ICD-10-CM | POA: Diagnosis not present

## 2020-07-31 DIAGNOSIS — I2510 Atherosclerotic heart disease of native coronary artery without angina pectoris: Secondary | ICD-10-CM | POA: Diagnosis not present

## 2020-07-31 DIAGNOSIS — I270 Primary pulmonary hypertension: Secondary | ICD-10-CM | POA: Diagnosis not present

## 2020-07-31 DIAGNOSIS — I10 Essential (primary) hypertension: Secondary | ICD-10-CM | POA: Diagnosis not present

## 2020-07-31 DIAGNOSIS — G4733 Obstructive sleep apnea (adult) (pediatric): Secondary | ICD-10-CM | POA: Diagnosis not present

## 2020-08-10 DIAGNOSIS — R338 Other retention of urine: Secondary | ICD-10-CM | POA: Diagnosis not present

## 2020-08-31 DIAGNOSIS — I2510 Atherosclerotic heart disease of native coronary artery without angina pectoris: Secondary | ICD-10-CM | POA: Diagnosis not present

## 2020-08-31 DIAGNOSIS — I10 Essential (primary) hypertension: Secondary | ICD-10-CM | POA: Diagnosis not present

## 2020-08-31 DIAGNOSIS — I270 Primary pulmonary hypertension: Secondary | ICD-10-CM | POA: Diagnosis not present

## 2020-08-31 DIAGNOSIS — G4733 Obstructive sleep apnea (adult) (pediatric): Secondary | ICD-10-CM | POA: Diagnosis not present

## 2020-09-07 DIAGNOSIS — R338 Other retention of urine: Secondary | ICD-10-CM | POA: Diagnosis not present

## 2020-09-15 ENCOUNTER — Other Ambulatory Visit: Payer: Self-pay | Admitting: Internal Medicine

## 2020-09-19 ENCOUNTER — Encounter: Payer: Self-pay | Admitting: Cardiology

## 2020-09-19 ENCOUNTER — Ambulatory Visit: Payer: Medicare Other | Admitting: Cardiology

## 2020-09-19 ENCOUNTER — Other Ambulatory Visit: Payer: Self-pay

## 2020-09-19 VITALS — BP 110/70 | HR 68 | Ht 70.0 in | Wt 176.0 lb

## 2020-09-19 DIAGNOSIS — E785 Hyperlipidemia, unspecified: Secondary | ICD-10-CM | POA: Diagnosis not present

## 2020-09-19 DIAGNOSIS — I2510 Atherosclerotic heart disease of native coronary artery without angina pectoris: Secondary | ICD-10-CM | POA: Diagnosis not present

## 2020-09-19 DIAGNOSIS — I272 Pulmonary hypertension, unspecified: Secondary | ICD-10-CM

## 2020-09-19 DIAGNOSIS — Z951 Presence of aortocoronary bypass graft: Secondary | ICD-10-CM

## 2020-09-19 DIAGNOSIS — J849 Interstitial pulmonary disease, unspecified: Secondary | ICD-10-CM | POA: Diagnosis not present

## 2020-09-19 MED ORDER — LISINOPRIL 20 MG PO TABS: 20.0000 mg | ORAL_TABLET | Freq: Every day | ORAL | 3 refills | Status: AC

## 2020-09-19 NOTE — Progress Notes (Signed)
Cardiology Office Note:    Date:  09/19/2020   ID:  Louis Houston, DOB [birth year], MRN [id number]  PCP:  Nicoletta Dress, MD  Cardiologist:  Jenne Campus, MD    Referring MD: Nicoletta Dress, MD   Chief Complaint  Patient presents with  . bilateral feet and ankle swelling    4-5 days   . Fatigue    1 month   . Neck Pain    3 weeks   . upset stomach    1 month    History of Present Illness:    Louis Houston is a 87 y.o. male with past medical history significant for coronary artery disease, status post coronary bypass graft years ago, essential hypertension, dyslipidemia, dyspnea on exertion, pulmonary hypertension.  He did have right side cardiac catheterization done which showed pulmonary hypertension probably group 3 with possibility of component of group 1 as well.  He is wearing oxygen all the time he is compliant with that.  He also got sleep study and the purpose of visit today is to discuss his sleep study.  His sleep study clearly showed moderate obstructive sleep apnea with some hypoxia with oxygen dropping down to 74% during the night. He comes today 2 months of follow-up overall he is doing poorly shortness of breath seems to be worse he does have some swelling of lower extremities he is also very tired exhausted he cannot do much.  He try to walk some but even walking to the restroom may give him a lot of problems.  Past Medical History:  Diagnosis Date  . Centrilobular emphysema ([place])   . CHF (congestive heart failure) ([place])   . Chronic respiratory failure with hypoxia ([place])   . Coronary artery disease involving native coronary artery of native heart without angina pectoris   . Dyslipidemia   . Dyspnea on exertion   . Essential hypertension   . Hypotension 03/20/2020  . ILD (interstitial lung disease) ([place])   . Obstructive sleep apnea   . Plaque psoriasis 03/20/2020  . Positive ANA (antinuclear antibody) 03/20/2020  . Psoriasis   . Pulmonary  hypertension ([place])   . Status post coronary artery bypass graft   . Type 2 diabetes mellitus ([place])     Past Surgical History:  Procedure Laterality Date  . CARDIAC SURGERY    . GALLBLADDER SURGERY    . PROSTATE SURGERY    . RIGHT HEART CATH N/A 11/25/2019   Procedure: RIGHT HEART CATH;  Surgeon: Jolaine Artist, MD;  Location: [facility];  Service: Cardiovascular;  Laterality: N/A;    Current Medications: Current Meds  Medication Sig  . aspirin 81 MG EC tablet Take 81 mg by mouth daily.  Marland Kitchen BREO ELLIPTA 100-25 MCG/INH AEPB Inhale 1 puff into the lungs daily. (Patient taking differently: Inhale 1 puff into the lungs daily.)  . ciprofloxacin (CIPRO) 500 MG tablet Take 500 mg by mouth 2 (two) times daily.  . furosemide (LASIX) 40 MG tablet TAKE ONE TABLET BY MOUTH TWICE DAILY (Patient taking differently: Take 40 mg by mouth 2 (two) times daily.)  . glipiZIDE (GLUCOTROL) 5 MG tablet Take 5 mg by mouth 2 (two) times daily.   Marland Kitchen lisinopril (PRINIVIL,ZESTRIL) 40 MG tablet Take 40 mg by mouth at bedtime.   . metFORMIN (GLUCOPHAGE) 500 MG tablet Take 500 mg by mouth daily.   . metoprolol tartrate (LOPRESSOR) 50 MG tablet Take 50 mg by mouth 2 (two) times daily.   Marland Kitchen  Multiple Vitamin (MULTI-VITAMINS) TABS Take 1 tablet by mouth daily. Unknown strength  . potassium chloride (KLOR-CON) 10 MEQ tablet Take 2 tablets (20 mEq total) by mouth 2 (two) times daily.  . simvastatin (ZOCOR) 40 MG tablet Take 40 mg by mouth every evening.      Allergies:   Flomax [tamsulosin]   Social History   Socioeconomic History  . Marital status: Widowed    Spouse name: Not on file  . Number of children: Not on file  . Years of education: Not on file  . Highest education level: Not on file  Occupational History  . Not on file  Tobacco Use  . Smoking status: Former Smoker    Types: Cigarettes    Quit date: 1965    Years since quitting: 57.4  . Smokeless tobacco: Never Used  Vaping Use  . Vaping  Use: Never used  Substance and Sexual Activity  . Alcohol use: Not Currently  . Drug use: Not Currently  . Sexual activity: Not on file  Other Topics Concern  . Not on file  Social History Narrative  . Not on file   Social Determinants of Health   Financial Resource Strain: Not on file  Food Insecurity: Not on file  Transportation Needs: Not on file  Physical Activity: Not on file  Stress: Not on file  Social Connections: Not on file     Family History: The patient's family history includes Diabetes in his daughter, daughter, and mother; Heart disease in his mother; Hypertension in his daughter and daughter; Lupus in his daughter and niece; Osteoarthritis in his daughter; Psoriasis in his daughter; Stroke in his father; Ulcers in his daughter. ROS:   Please see the history of present illness.    All 14 point review of systems negative except as described per history of present illness  EKGs/Labs/Other Studies Reviewed:      Recent Labs: 01/16/2020: Hemoglobin 12.3; Platelets 385.0 02/22/2020: BUN 23; Creatinine, Ser 1.44; Potassium 4.1; Sodium 138  Recent Lipid Panel No results found for: CHOL, TRIG, HDL, CHOLHDL, VLDL, LDLCALC, LDLDIRECT  Physical Exam:    VS:  BP 110/70 (BP Location: Left Arm, Patient Position: Sitting)   Pulse 68   Ht _0  (1.778 m)   Wt 176 lb (79.8 kg)   SpO2 95%   BMI 25.25 kg/m     Wt Readings from Last 3 Encounters:  09/19/20 176 lb (79.8 kg)  06/22/20 179 lb (81.2 kg)  04/16/20 181 lb 6.4 oz (82.3 kg)     GEN:  Well nourished, well developed in no acute distress HEENT: Normal NECK: No JVD; No carotid bruits LYMPHATICS: No lymphadenopathy CARDIAC: RRR, no murmurs, no rubs, no gallops RESPIRATORY:  Clear to auscultation without rales, wheezing or rhonchi  ABDOMEN: Soft, non-tender, non-distended MUSCULOSKELETAL:  No edema; No deformity  SKIN: Warm and dry LOWER EXTREMITIES: Minimal g NEUROLOGIC:  Alert and oriented x  3 PSYCHIATRIC:  Normal affect   ASSESSMENT:    1. Pulmonary hypertension ([place])   2. Coronary artery disease involving native coronary artery of native heart without angina pectoris   3. ILD (interstitial lung disease) ([place])   4. Status post coronary artery bypass graft   5. Dyslipidemia    PLAN:    In order of problems listed above:  1. Pulmonary hypertension followed by our pulmonary clinic.  Overall doing poorly. 2. Coronary disease seems to be stable from that point review no chest pain tightness squeezing pressure burning chest. 3. Interstitial  lung disease followed by pulmonary. 4. Dyslipidemia.  He is taking Zocor which I will continue. 5. Profound weakness fatigue shortness of breath and just not being well I will check his CBC plus differential he does have indwelling urinary catheter.  He also looked pale I will make sure he does not have any significant anemia.  He will have complete metabolic panel done as well today.  I am sad to see his gradual deterioration I am afraid there is not much we can do to improve his overall wellbeing.   Medication Adjustments/Labs and Tests Ordered: Current medicines are reviewed at length with the patient today.  Concerns regarding medicines are outlined above.  No orders of the defined types were placed in this encounter.  Medication changes: No orders of the defined types were placed in this encounter.   Signed, Park Liter, MD, [facility] 09/19/2020 2:12 PM    [place]

## 2020-09-19 NOTE — Patient Instructions (Signed)
Medication Instructions:  Your physician has recommended you make the following change in your medication: Lisinopril 20 mg every day  *If you need a refill on your cardiac medications before your next appointment, please call your pharmacy*   Lab Work: Your physician recommends that you return for lab work in: CBC and CMET  If you have labs (blood work) drawn today and your tests are completely normal, you will receive your results only by: Marland Kitchen MyChart Message (if you have MyChart) OR . A paper copy in the mail If you have any lab test that is abnormal or we need to change your treatment, we will call you to review the results.   Testing/Procedures: None   Follow-Up: At [facility], you and your health needs are our priority.  As part of our continuing mission to provide you with exceptional heart care, we have created designated Provider Care Teams.  These Care Teams include your primary Cardiologist (physician) and Advanced Practice Providers (APPs -  Physician Assistants and Nurse Practitioners) who all work together to provide you with the care you need, when you need it.  We recommend signing up for the patient portal called "MyChart".  Sign up information is provided on this After Visit Summary.  MyChart is used to connect with patients for Virtual Visits (Telemedicine).  Patients are able to view lab/test results, encounter notes, upcoming appointments, etc.  Non-urgent messages can be sent to your provider as well.   To learn more about what you can do with MyChart, go to [email or web address].    Your next appointment:   2 month(s)  The format for your next appointment:   In Person In person Provider:   Jenne Campus, MD   Other Instructions

## 2020-09-19 NOTE — Addendum Note (Signed)
Addended by: Darrel Reach on: 09/19/2020 02:19 PM   Modules accepted: Orders

## 2020-09-20 LAB — COMPREHENSIVE METABOLIC PANEL
ALT: 76 IU/L — ABNORMAL HIGH (ref 0–44)
AST: 47 IU/L — ABNORMAL HIGH (ref 0–40)
Albumin/Globulin Ratio: 2.3 — ABNORMAL HIGH (ref 1.2–2.2)
Albumin: 4.3 g/dL (ref 3.6–4.6)
Alkaline Phosphatase: 122 IU/L — ABNORMAL HIGH (ref 44–121)
BUN/Creatinine Ratio: 20 (ref 10–24)
BUN: 31 mg/dL — ABNORMAL HIGH (ref 8–27)
Bilirubin Total: 0.9 mg/dL (ref 0.0–1.2)
CO2: 20 mmol/L (ref 20–29)
Calcium: 9.6 mg/dL (ref 8.6–10.2)
Chloride: 99 mmol/L (ref 96–106)
Creatinine, Ser: 1.53 mg/dL — ABNORMAL HIGH (ref 0.76–1.27)
Globulin, Total: 1.9 g/dL (ref 1.5–4.5)
Glucose: 235 mg/dL — ABNORMAL HIGH (ref 65–99)
Potassium: 5.4 mmol/L — ABNORMAL HIGH (ref 3.5–5.2)
Sodium: 140 mmol/L (ref 134–144)
Total Protein: 6.2 g/dL (ref 6.0–8.5)
eGFR: 44 mL/min/{1.73_m2} — ABNORMAL LOW (ref >59)

## 2020-09-20 LAB — CBC
Hematocrit: 38.8 % (ref 37.5–51.0)
Hemoglobin: 12.7 g/dL — ABNORMAL LOW (ref 13.0–17.7)
MCH: 32.5 pg (ref 26.6–33.0)
MCHC: 32.7 g/dL (ref 31.5–35.7)
MCV: 99 fL — ABNORMAL HIGH (ref 79–97)
Platelets: 231 10*3/uL (ref 150–450)
RBC: 3.91 x10E6/uL — ABNORMAL LOW (ref 4.14–5.80)
RDW: 13.9 % (ref 11.6–15.4)
WBC: 8.3 10*3/uL (ref 3.4–10.8)

## 2020-09-26 DIAGNOSIS — I2510 Atherosclerotic heart disease of native coronary artery without angina pectoris: Secondary | ICD-10-CM | POA: Diagnosis not present

## 2020-09-26 DIAGNOSIS — E785 Hyperlipidemia, unspecified: Secondary | ICD-10-CM | POA: Diagnosis not present

## 2020-09-26 DIAGNOSIS — I270 Primary pulmonary hypertension: Secondary | ICD-10-CM | POA: Diagnosis not present

## 2020-09-26 DIAGNOSIS — E1169 Type 2 diabetes mellitus with other specified complication: Secondary | ICD-10-CM | POA: Diagnosis not present

## 2020-09-26 DIAGNOSIS — I10 Essential (primary) hypertension: Secondary | ICD-10-CM | POA: Diagnosis not present

## 2020-09-26 DIAGNOSIS — Z79899 Other long term (current) drug therapy: Secondary | ICD-10-CM | POA: Diagnosis not present

## 2020-09-26 DIAGNOSIS — J449 Chronic obstructive pulmonary disease, unspecified: Secondary | ICD-10-CM | POA: Diagnosis not present

## 2020-09-26 DIAGNOSIS — D509 Iron deficiency anemia, unspecified: Secondary | ICD-10-CM | POA: Diagnosis not present

## 2020-09-30 DIAGNOSIS — G4733 Obstructive sleep apnea (adult) (pediatric): Secondary | ICD-10-CM | POA: Diagnosis not present

## 2020-09-30 DIAGNOSIS — I10 Essential (primary) hypertension: Secondary | ICD-10-CM | POA: Diagnosis not present

## 2020-09-30 DIAGNOSIS — I270 Primary pulmonary hypertension: Secondary | ICD-10-CM | POA: Diagnosis not present

## 2020-09-30 DIAGNOSIS — I2510 Atherosclerotic heart disease of native coronary artery without angina pectoris: Secondary | ICD-10-CM | POA: Diagnosis not present

## 2020-10-09 DIAGNOSIS — R338 Other retention of urine: Secondary | ICD-10-CM | POA: Diagnosis not present

## 2020-10-31 DIAGNOSIS — G4733 Obstructive sleep apnea (adult) (pediatric): Secondary | ICD-10-CM | POA: Diagnosis not present

## 2020-10-31 DIAGNOSIS — I10 Essential (primary) hypertension: Secondary | ICD-10-CM | POA: Diagnosis not present

## 2020-10-31 DIAGNOSIS — I2510 Atherosclerotic heart disease of native coronary artery without angina pectoris: Secondary | ICD-10-CM | POA: Diagnosis not present

## 2020-10-31 DIAGNOSIS — I270 Primary pulmonary hypertension: Secondary | ICD-10-CM | POA: Diagnosis not present

## 2020-11-02 ENCOUNTER — Other Ambulatory Visit: Payer: Self-pay | Admitting: Cardiology

## 2020-11-02 NOTE — Telephone Encounter (Signed)
Rx approved and sent

## 2020-11-07 DIAGNOSIS — Z9981 Dependence on supplemental oxygen: Secondary | ICD-10-CM | POA: Diagnosis not present

## 2020-11-07 DIAGNOSIS — I5022 Chronic systolic (congestive) heart failure: Secondary | ICD-10-CM | POA: Diagnosis not present

## 2020-11-07 DIAGNOSIS — I272 Pulmonary hypertension, unspecified: Secondary | ICD-10-CM | POA: Diagnosis not present

## 2020-11-07 DIAGNOSIS — I420 Dilated cardiomyopathy: Secondary | ICD-10-CM | POA: Diagnosis not present

## 2020-11-07 DIAGNOSIS — I2510 Atherosclerotic heart disease of native coronary artery without angina pectoris: Secondary | ICD-10-CM | POA: Diagnosis not present

## 2020-11-07 DIAGNOSIS — E7800 Pure hypercholesterolemia, unspecified: Secondary | ICD-10-CM | POA: Diagnosis not present

## 2020-11-07 DIAGNOSIS — E875 Hyperkalemia: Secondary | ICD-10-CM | POA: Diagnosis not present

## 2020-11-07 DIAGNOSIS — I129 Hypertensive chronic kidney disease with stage 1 through stage 4 chronic kidney disease, or unspecified chronic kidney disease: Secondary | ICD-10-CM | POA: Diagnosis not present

## 2020-11-07 DIAGNOSIS — Z7982 Long term (current) use of aspirin: Secondary | ICD-10-CM | POA: Diagnosis not present

## 2020-11-07 DIAGNOSIS — R0602 Shortness of breath: Secondary | ICD-10-CM | POA: Diagnosis not present

## 2020-11-07 DIAGNOSIS — J449 Chronic obstructive pulmonary disease, unspecified: Secondary | ICD-10-CM | POA: Diagnosis not present

## 2020-11-07 DIAGNOSIS — N189 Chronic kidney disease, unspecified: Secondary | ICD-10-CM | POA: Diagnosis not present

## 2020-11-07 DIAGNOSIS — R188 Other ascites: Secondary | ICD-10-CM | POA: Diagnosis not present

## 2020-11-07 DIAGNOSIS — I130 Hypertensive heart and chronic kidney disease with heart failure and stage 1 through stage 4 chronic kidney disease, or unspecified chronic kidney disease: Secondary | ICD-10-CM | POA: Diagnosis not present

## 2020-11-07 DIAGNOSIS — E1122 Type 2 diabetes mellitus with diabetic chronic kidney disease: Secondary | ICD-10-CM | POA: Diagnosis not present

## 2020-11-07 DIAGNOSIS — Z7984 Long term (current) use of oral hypoglycemic drugs: Secondary | ICD-10-CM | POA: Diagnosis not present

## 2020-11-07 DIAGNOSIS — J9611 Chronic respiratory failure with hypoxia: Secondary | ICD-10-CM | POA: Diagnosis not present

## 2020-11-07 DIAGNOSIS — R339 Retention of urine, unspecified: Secondary | ICD-10-CM | POA: Diagnosis not present

## 2020-11-07 DIAGNOSIS — Z79899 Other long term (current) drug therapy: Secondary | ICD-10-CM | POA: Diagnosis not present

## 2020-11-07 DIAGNOSIS — Z951 Presence of aortocoronary bypass graft: Secondary | ICD-10-CM | POA: Diagnosis not present

## 2020-11-08 DIAGNOSIS — Z7401 Bed confinement status: Secondary | ICD-10-CM | POA: Diagnosis not present

## 2020-11-08 DIAGNOSIS — Z743 Need for continuous supervision: Secondary | ICD-10-CM | POA: Diagnosis not present

## 2020-11-08 DIAGNOSIS — R0602 Shortness of breath: Secondary | ICD-10-CM | POA: Diagnosis not present

## 2020-11-08 DIAGNOSIS — R0902 Hypoxemia: Secondary | ICD-10-CM | POA: Diagnosis not present

## 2020-11-13 ENCOUNTER — Encounter (HOSPITAL_COMMUNITY): Payer: Medicare Other | Admitting: Internal Medicine

## 2020-11-14 ENCOUNTER — Encounter (HOSPITAL_COMMUNITY): Payer: Medicare Other | Admitting: Internal Medicine

## 2020-11-29 ENCOUNTER — Ambulatory Visit: Payer: Medicare Other | Admitting: Cardiology

## 2020-12-03 DEATH — deceased

## 2021-03-09 IMAGING — CT CT CHEST HIGH RESOLUTION W/O CM
2 of 7 series · 13 of 36 positions shown, 16 images · non-contrast
Comparison: 09/06/2019 chest CT angiogram.

CLINICAL DATA: Dyspnea.  Suspected CHF.  Initial workup.

EXAM:
CT CHEST WITHOUT CONTRAST
TECHNIQUE: Multidetector CT imaging of the chest was performed following the
standard protocol without intravenous contrast. High resolution
imaging of the lungs, as well as inspiratory and expiratory imaging,
was performed.

[Series 6: coronals · coronal · 0.49mm/px · 3 of 143 slices shown]
[im 29/143  lung]
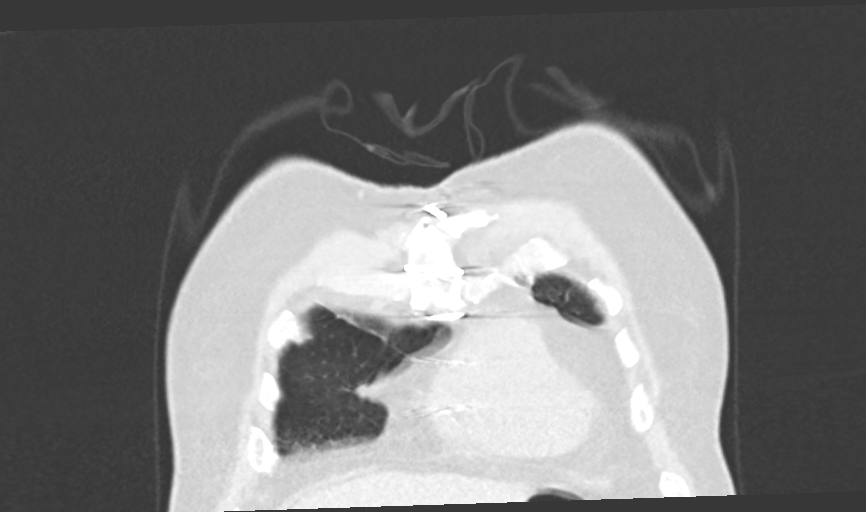
[im 57/143  lung]
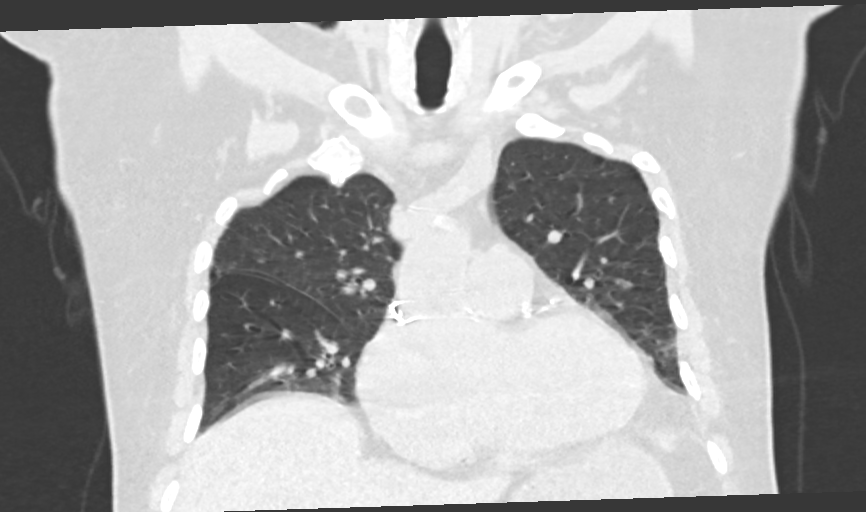
[im 86/143  lung]
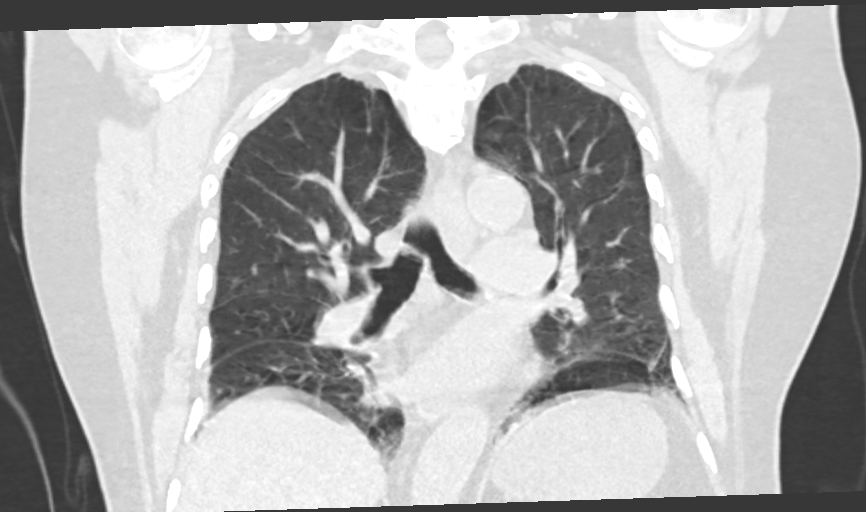

[Series 9: hi res thins · axial · 0.71mm/px · z∈[+131,+330]mm · 10 of 239 slices shown, 13 images]
[im 20/239  mediastinal]
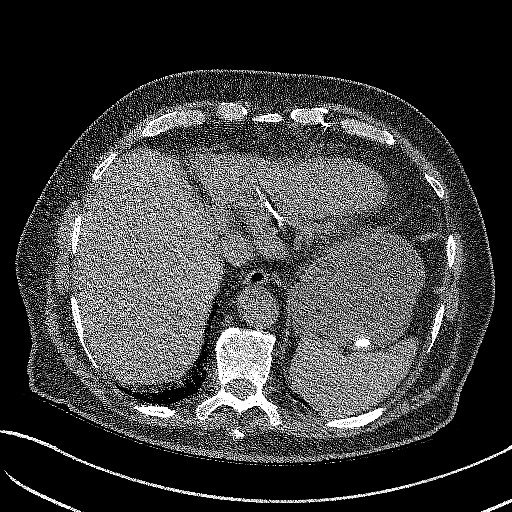
[im 20/239  lung]
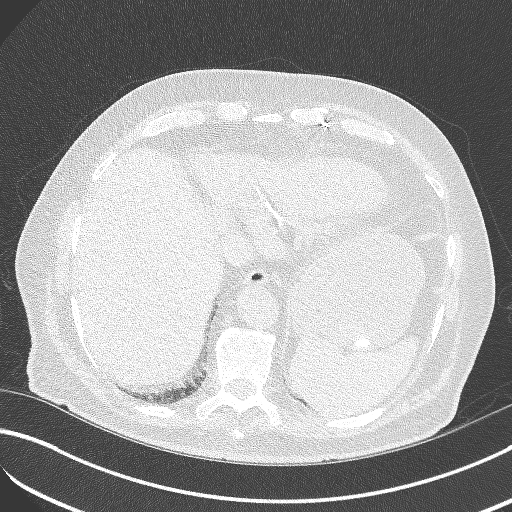
[im 40/239  lung]
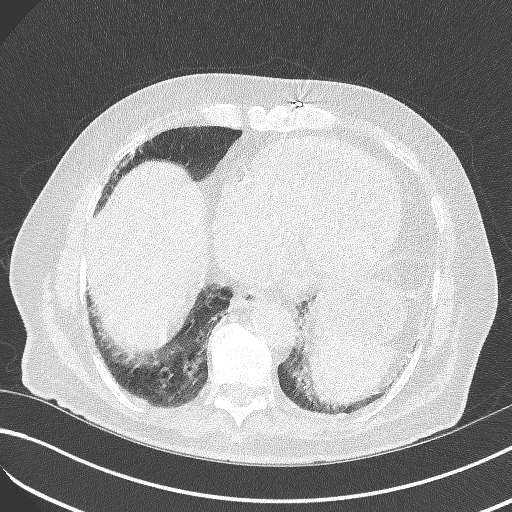
[im 60/239  lung]
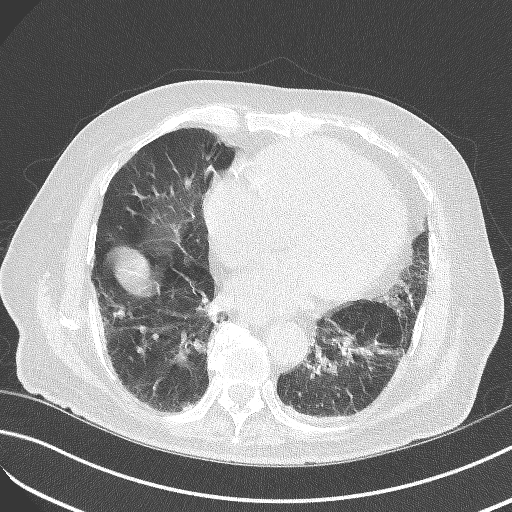
[im 80/239  lung]
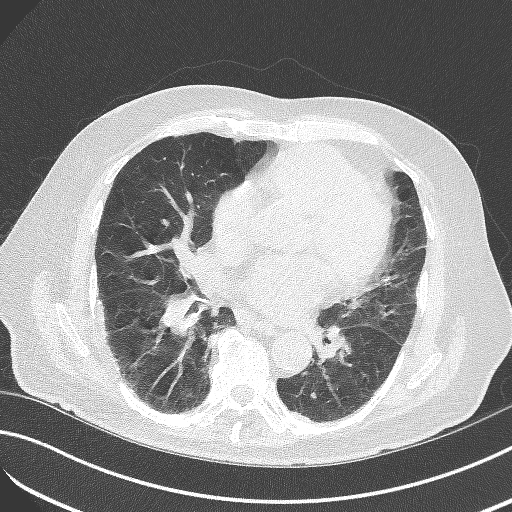
[im 100/239  mediastinal]
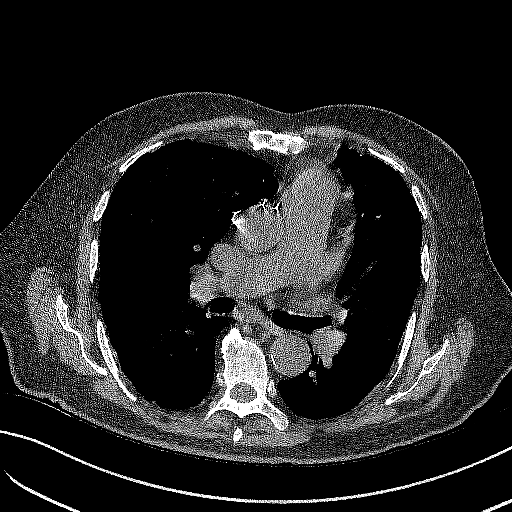
[im 100/239  lung]
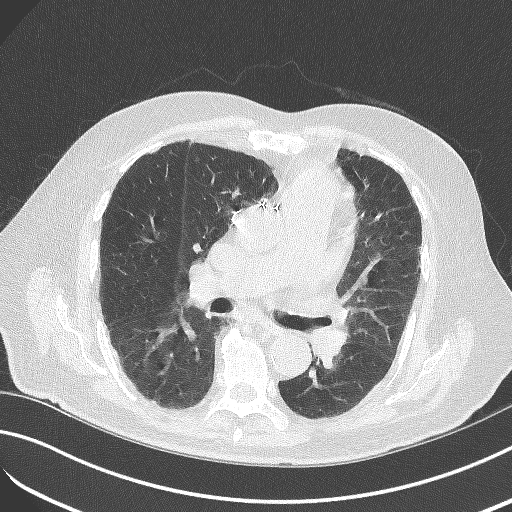
[im 139/239  lung]
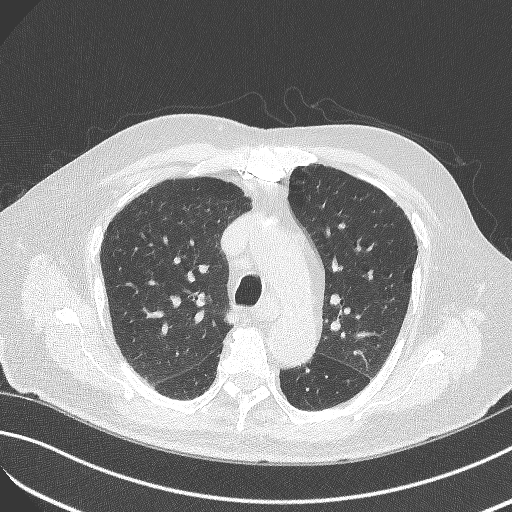
[im 159/239  lung]
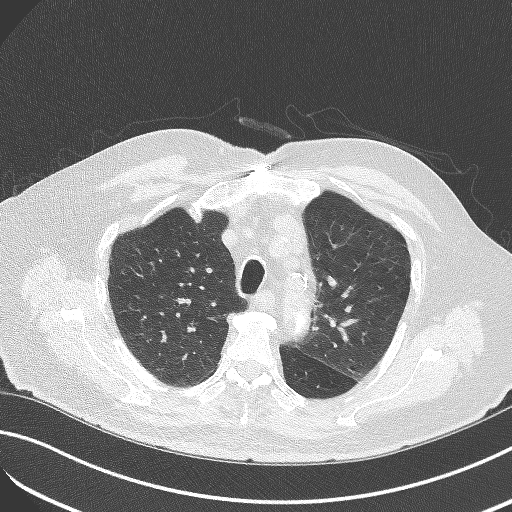
[im 179/239  lung]
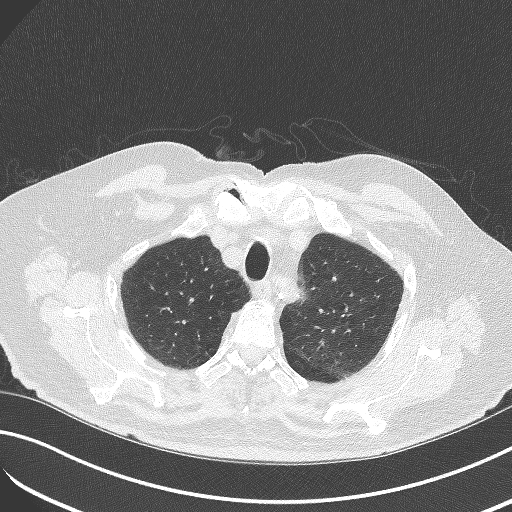
[im 199/239  mediastinal]
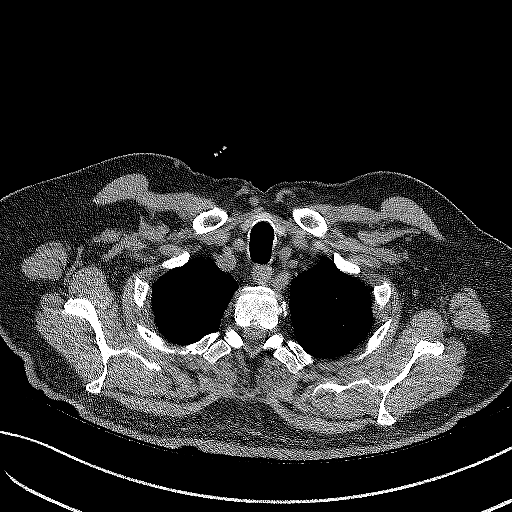
[im 199/239  lung]
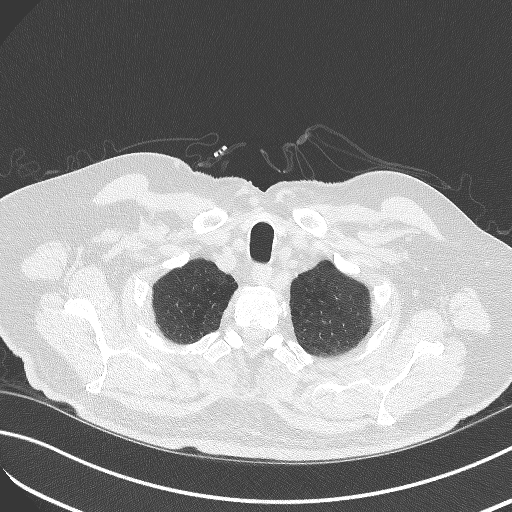
[im 219/239  lung]
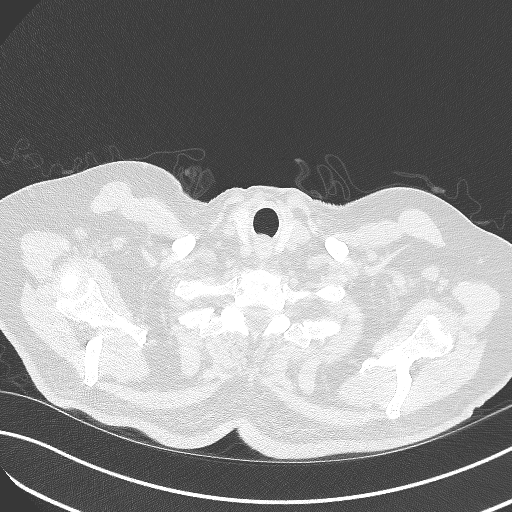

[13 of 36 positions shown; findings below may reference images not displayed]

FINDINGS: Motion degraded scan, particularly in the lower lungs, limiting
assessment.

Cardiovascular: Stable mild cardiomegaly. No significant pericardial
effusion/thickening. Three-vessel coronary atherosclerosis status
post CABG. Atherosclerotic nonaneurysmal thoracic aorta. Stable
dilated main pulmonary artery (3.5 cm diameter).

Mediastinum/Nodes: No discrete thyroid nodules. Unremarkable
esophagus. No pathologically enlarged axillary, mediastinal or hilar
lymph nodes, noting limited sensitivity for the detection of hilar
adenopathy on this noncontrast study.

Lungs/Pleura: No pneumothorax. No pleural effusion. Mild smooth
bilateral pleural thickening posteriorly with associated faint
pleural calcifications, unchanged. Moderate centrilobular and
paraseptal emphysema with diffuse bronchial wall thickening. No
acute consolidative airspace disease, lung masses or significant
pulmonary nodules. Stable mild curvilinear parenchymal band at the
dependent left lung base. Suggestion of mild subpleural reticulation
and ground-glass opacity at the lung bases, poorly evaluated due to
motion degradation, not definitely changed since 09/06/2019. No
definite regions of traction bronchiectasis or frank honeycombing.
No significant lobular air trapping or evidence of
tracheobronchomalacia on the expiration sequence.

Upper abdomen: No acute abnormality.

Musculoskeletal: No aggressive appearing focal osseous lesions.
Intact sternotomy wires. Moderate thoracic spondylosis.
IMPRESSION: 1. Limited motion degraded scan.
2. Moderate centrilobular and paraseptal emphysema with diffuse
bronchial wall thickening, suggesting COPD.
3. Stable mild smooth bilateral pleural thickening posteriorly with
associated faint pleural calcifications, cannot exclude asbestos
related pleural disease. No pleural effusions.
4. Suggestion of mild subpleural reticulation and ground-glass
opacity at the lung bases, poorly evaluated due to motion
degradation, not definitely changed since 09/06/2019 high-resolution
chest CT study. No definite regions of traction bronchiectasis or
frank honeycombing. A mild interstitial lung disease such as early
asbestosis or UIP cannot be excluded. Consider follow-up
high-resolution chest CT study in 6-12 months, as clinically
warranted. Findings are indeterminate for UIP per consensus
guidelines: Diagnosis of Idiopathic Pulmonary Fibrosis: An Official
ATS/ERS/JRS/ALAT Clinical Practice Guideline. Am J Respir Crit Care
Med Vol 198, Saib 5, ppe22-e[DATE].
5. Stable mild cardiomegaly. Stable dilated main pulmonary artery,
suggesting chronic pulmonary arterial hypertension.
6. Aortic Atherosclerosis (8RYLX-ENC.C) and Emphysema (8RYLX-18Z.H).
